# Patient Record
Sex: Female | Born: 1955
Health system: Southern US, Community
[De-identification: ages and names within clinical notes are randomized; demographics above are authoritative.]

## PROBLEM LIST (undated history)

## (undated) DIAGNOSIS — M1990 Unspecified osteoarthritis, unspecified site: Secondary | ICD-10-CM

## (undated) DIAGNOSIS — I8390 Asymptomatic varicose veins of unspecified lower extremity: Secondary | ICD-10-CM

## (undated) HISTORY — DX: Unspecified osteoarthritis, unspecified site: M19.90

## (undated) HISTORY — PX: KNEE SURGERY: SHX244

## (undated) HISTORY — DX: Asymptomatic varicose veins of unspecified lower extremity: I83.90

## (undated) HISTORY — PX: TONSILLECTOMY AND ADENOIDECTOMY: SUR1326

---

## 1984-07-21 HISTORY — PX: ECTOPIC PREGNANCY SURGERY: SHX613

## 2018-03-10 ENCOUNTER — Ambulatory Visit: Payer: Federal, State, Local not specified - PPO | Admitting: Family Medicine

## 2018-03-10 ENCOUNTER — Encounter: Payer: Self-pay | Admitting: Family Medicine

## 2018-03-10 VITALS — BP 128/74 | HR 74 | Temp 97.9°F | Ht 67.0 in | Wt 241.8 lb

## 2018-03-10 DIAGNOSIS — M1990 Unspecified osteoarthritis, unspecified site: Secondary | ICD-10-CM

## 2018-03-10 DIAGNOSIS — I8390 Asymptomatic varicose veins of unspecified lower extremity: Secondary | ICD-10-CM

## 2018-03-10 DIAGNOSIS — L989 Disorder of the skin and subcutaneous tissue, unspecified: Secondary | ICD-10-CM

## 2018-03-10 DIAGNOSIS — L680 Hirsutism: Secondary | ICD-10-CM | POA: Diagnosis not present

## 2018-03-10 MED ORDER — EFLORNITHINE HCL 13.9 % EX CREA: TOPICAL_CREAM | CUTANEOUS | 1 refills | Status: DC

## 2018-03-10 NOTE — Patient Instructions (Addendum)
It was very nice to see you today!  Please try the cream for your face. We will also refer you to dermatology.  Please continue your meds as needed for arthritis and let me know if you want to see our physical therapist.  Please try compression and leg elevation for your varicose veins. Let me know if you want to see a vein specialist.  Come back in early October for you flu shot.  Please come back to see me for your physical when you are due.   Take care, Dr Jerline Pain   Varicose Veins Varicose veins are veins that have become enlarged and twisted. They are usually seen in the legs but can occur in other parts of the body as well. What are the causes? This condition is the result of valves in the veins not working properly. Valves in the veins help to return blood from the leg to the heart. If these valves are damaged, blood flows backward and backs up into the veins in the leg near the skin. This causes the veins to become larger. What increases the risk? People who are on their feet a lot, who are pregnant, or who are overweight are more likely to develop varicose veins. What are the signs or symptoms?  Bulging, twisted-appearing, bluish veins, most commonly found on the legs.  Leg pain or a feeling of heaviness. These symptoms may be worse at the end of the day.  Leg swelling.  Changes in skin color. How is this diagnosed? A health care provider can usually diagnose varicose veins by examining your legs. Your health care provider may also recommend an ultrasound of your leg veins. How is this treated? Most varicose veins can be treated at home.However, other treatments are available for people who have persistent symptoms or want to improve the cosmetic appearance of the varicose veins. These treatment options include:  Sclerotherapy. A solution is injected into the vein to close it off.  Laser treatment. A laser is used to heat the vein to close it off.  Radiofrequency vein  ablation. An electrical current produced by radio waves is used to close off the vein.  Phlebectomy. The vein is surgically removed through small incisions made over the varicose vein.  Vein ligation and stripping. The vein is surgically removed through incisions made over the varicose vein after the vein has been tied (ligated).  Follow these instructions at home:  Do not stand or sit in one position for long periods of time. Do not sit with your legs crossed. Rest with your legs raised during the day.  Wear compression stockings as directed by your health care provider. These stockings help to prevent blood clots and reduce swelling in your legs.  Do not wear other tight, encircling garments around your legs, pelvis, or waist.  Walk as much as possible to increase blood flow.  Raise the foot of your bed at night with 2-inch blocks.  If you get a cut in the skin over the vein and the vein bleeds, lie down with your leg raised and press on it with a clean cloth until the bleeding stops. Then place a bandage (dressing) on the cut. See your health care provider if it continues to bleed. Contact a health care provider if:  The skin around your ankle starts to break down.  You have pain, redness, tenderness, or hard swelling in your leg over a vein.  You are uncomfortable because of leg pain. This information is not intended  to replace advice given to you by your health care provider. Make sure you discuss any questions you have with your health care provider. Document Released: 04/16/2005 Document Revised: 12/13/2015 Document Reviewed: 01/08/2016 Elsevier Interactive Patient Education  2017 [employer].

## 2018-03-10 NOTE — Assessment & Plan Note (Signed)
Symptoms are stable.  She will continue her glucosamine-chondroitin and ibuprofen.  Also recommended over-the-counter Tylenol as needed.  She is possibly interested in seeing our physical therapist however she deferred for the time being.  She will follow-up as needed.

## 2018-03-10 NOTE — Assessment & Plan Note (Signed)
No red flag signs or symptoms.  Discussed conservative measures with patient including frequent leg elevation and compression.  Discussed possible referral to vascular surgeon, however patient declined.  Discussed reasons to return to care.

## 2018-03-10 NOTE — Assessment & Plan Note (Signed)
Likely secondary to postmenopausal hormonal changes.  Will start [name] cream today.  Also place referral to dermatology for the above.  Discussed potential side effects of medication.

## 2018-03-10 NOTE — Progress Notes (Signed)
Subjective:  Kathy Joseph is a 62 y.o. female who presents today with a chief complaint of facial hair and to establish care.   HPI:  Facial hair, chronic problem, new to provider Patient with several year history of facial hair.  She has previously been prescribed a cream however does not remember the name of this.  She is also tried laser treatments in the past that did not significantly improve her symptoms.  She has not tried any recent treatments.  No obvious alleviating or aggravating factors.  Arthritis, chronic problem, new to provider Several year history.  Pain mostly located in bilateral knees and hands.  She takes glucosamine-chondroitin and ibuprofen as needed.  Symptoms are stable.  She has seen physical therapy in the past.  Varicose Veins, chronic problem, new to provider Several month history.  Located in bilateral lower legs however more prominent on the left.  No specific treatments tried.  Occasionally painful.  Worsened recently due to being on her feet while moving into her new house.  ROS: Per HPI, otherwise a complete review of systems was negative.   PMH:  The following were reviewed and entered/updated in epic: Past Medical History:  Diagnosis Date  . Arthritis   . Varicose veins of lower extremity    Patient Active Problem List   Diagnosis Date Noted  . Precancerous skin lesion 03/10/2018  . Hirsutism 03/10/2018  . Varicose veins of lower extremity 03/10/2018  . Arthritis 03/10/2018   Past Surgical History:  Procedure Laterality Date  . ECTOPIC PREGNANCY SURGERY  1986  . KNEE SURGERY Right   . TONSILLECTOMY AND ADENOIDECTOMY      Family History  Problem Relation Age of Onset  . Breast cancer Mother   . Colon cancer Father        Diagnosed in 76s.   . Breast cancer Sister   . Breast cancer Paternal Aunt     Medications- reviewed and updated Current Outpatient Medications  Medication Sig Dispense Refill  . glucosamine-chondroitin  500-400 MG tablet Take 1 tablet by mouth 3 (three) times daily.    Marland Kitchen ibuprofen (ADVIL,MOTRIN) 200 MG tablet Take 200 mg by mouth every 6 (six) hours as needed.    . Multiple Vitamin (MULTIVITAMIN) tablet Take 1 tablet by mouth daily.    . Eflornithine HCl ([name]) 13.9 % cream Apply thin film to face twice daily. 45 g 1   No current facility-administered medications for this visit.     Allergies-reviewed and updated Allergies  Allergen Reactions  . Cyclosporine Rash    Social History   Socioeconomic History  . Marital status: Married    Spouse name: Not on file  . Number of children: Not on file  . Years of education: Not on file  . Highest education level: Not on file  Occupational History  . Not on file  Social Needs  . Financial resource strain: Not on file  . Food insecurity:    Worry: Not on file    Inability: Not on file  . Transportation needs:    Medical: Not on file    Non-medical: Not on file  Tobacco Use  . Smoking status: Never Smoker  . Smokeless tobacco: Never Used  Substance and Sexual Activity  . Alcohol use: Yes    Alcohol/week: 3.0 standard drinks    Types: 3 Glasses of wine per week  . Drug use: Never  . Sexual activity: Yes    Partners: Male  Lifestyle  . Physical activity:  Days per week: Not on file    Minutes per session: Not on file  . Stress: Not on file  Relationships  . Social connections:    Talks on phone: Not on file    Gets together: Not on file    Attends religious service: Not on file    Active member of club or organization: Not on file    Attends meetings of clubs or organizations: Not on file    Relationship status: Not on file  Other Topics Concern  . Not on file  Social History Narrative  . Not on file    Objective:  Physical Exam: BP 128/74 (BP Location: Left Arm, Patient Position: Sitting, Cuff Size: Large)   Pulse 74   Temp 97.9 F (36.6 C) (Oral)   Ht 5\' 7"  (1.702 m)   Wt 241 lb 12.8 oz (109.7 kg)   SpO2  97%   BMI 37.87 kg/m   Gen: NAD, resting comfortably HEENT: Coarse white hair on lower mandible and chin. CV: RRR with no murmurs appreciated Pulm: NWOB, CTAB with no crackles, wheezes, or rhonchi GI: Normal bowel sounds present. Soft, Nontender, Nondistended. MSK:  -Knees: No deformities.  Crepitus with active range of motion.  Strength 5 out of 5 throughout.  Neurovascular intact distally. - Lower extremities: Bilateral varicosities noted. Skin: Warm, dry Neuro: Grossly normal, moves all extremities Psych: Normal affect and thought content  Assessment/Plan:  Varicose veins of lower extremity No red flag signs or symptoms.  Discussed conservative measures with patient including frequent leg elevation and compression.  Discussed possible referral to vascular surgeon, however patient declined.  Discussed reasons to return to care.  Precancerous skin lesion Will place referral to dermatology for annual skin check.  Hirsutism Likely secondary to postmenopausal hormonal changes.  Will start [name] cream today.  Also place referral to dermatology for the above.  Discussed potential side effects of medication.   Arthritis Symptoms are stable.  She will continue her glucosamine-chondroitin and ibuprofen.  Also recommended over-the-counter Tylenol as needed.  She is possibly interested in seeing our physical therapist however she deferred for the time being.  She will follow-up as needed.  Preventative Healthcare Patient was instructed to return soon for CPE.  We will obtain records from her previous PCP. Health Maintenance Due  Topic Date Due  . Hepatitis C Screening  [birth year]  . HIV Screening  11/06/1968  . TETANUS/TDAP  11/06/1972  . PAP SMEAR  11/07/1974  . MAMMOGRAM  11/07/2003  . INFLUENZA VACCINE  02/18/2018   Algis Greenhouse. Jerline Pain, MD 03/10/2018 10:50 AM

## 2018-03-10 NOTE — Assessment & Plan Note (Signed)
Will place referral to dermatology for annual skin check.

## 2018-05-04 ENCOUNTER — Ambulatory Visit (INDEPENDENT_AMBULATORY_CARE_PROVIDER_SITE_OTHER): Payer: Federal, State, Local not specified - PPO

## 2018-05-04 DIAGNOSIS — Z23 Encounter for immunization: Secondary | ICD-10-CM | POA: Diagnosis not present

## 2018-06-01 DIAGNOSIS — K080 Exfoliation of teeth due to systemic causes: Secondary | ICD-10-CM | POA: Diagnosis not present

## 2018-06-11 DIAGNOSIS — D485 Neoplasm of uncertain behavior of skin: Secondary | ICD-10-CM | POA: Diagnosis not present

## 2018-06-11 DIAGNOSIS — D229 Melanocytic nevi, unspecified: Secondary | ICD-10-CM | POA: Diagnosis not present

## 2018-06-11 DIAGNOSIS — L680 Hirsutism: Secondary | ICD-10-CM | POA: Diagnosis not present

## 2018-06-11 DIAGNOSIS — L905 Scar conditions and fibrosis of skin: Secondary | ICD-10-CM | POA: Diagnosis not present

## 2018-06-11 DIAGNOSIS — D224 Melanocytic nevi of scalp and neck: Secondary | ICD-10-CM | POA: Diagnosis not present

## 2018-06-11 DIAGNOSIS — D2239 Melanocytic nevi of other parts of face: Secondary | ICD-10-CM | POA: Diagnosis not present

## 2018-06-11 HISTORY — DX: Melanocytic nevi, unspecified: D22.9

## 2018-07-08 DIAGNOSIS — D2339 Other benign neoplasm of skin of other parts of face: Secondary | ICD-10-CM | POA: Diagnosis not present

## 2018-07-08 DIAGNOSIS — L905 Scar conditions and fibrosis of skin: Secondary | ICD-10-CM | POA: Diagnosis not present

## 2018-07-20 DIAGNOSIS — K080 Exfoliation of teeth due to systemic causes: Secondary | ICD-10-CM | POA: Diagnosis not present

## 2018-09-09 DIAGNOSIS — D0371 Melanoma in situ of right lower limb, including hip: Secondary | ICD-10-CM | POA: Insufficient documentation

## 2018-09-28 ENCOUNTER — Encounter: Payer: Self-pay | Admitting: Family Medicine

## 2018-09-28 ENCOUNTER — Ambulatory Visit: Payer: Federal, State, Local not specified - PPO | Admitting: Family Medicine

## 2018-09-28 VITALS — BP 110/66 | HR 67 | Temp 97.8°F | Ht 67.0 in | Wt 245.2 lb

## 2018-09-28 DIAGNOSIS — M7662 Achilles tendinitis, left leg: Secondary | ICD-10-CM

## 2018-09-28 DIAGNOSIS — M722 Plantar fascial fibromatosis: Secondary | ICD-10-CM

## 2018-09-28 DIAGNOSIS — L680 Hirsutism: Secondary | ICD-10-CM

## 2018-09-28 DIAGNOSIS — M79662 Pain in left lower leg: Secondary | ICD-10-CM

## 2018-09-28 MED ORDER — EFLORNITHINE HCL 13.9 % EX CREA: TOPICAL_CREAM | CUTANEOUS | 1 refills | Status: DC

## 2018-09-28 MED ORDER — DICLOFENAC SODIUM 75 MG PO TBEC: 75.0000 mg | DELAYED_RELEASE_TABLET | Freq: Two times a day (BID) | ORAL | 0 refills | Status: DC

## 2018-09-28 NOTE — Progress Notes (Signed)
   Chief Complaint:  Kathy Joseph is a 63 y.o. female who presents for same day appointment with a chief complaint of leg Joseph.   Assessment/Plan:  Left calf Joseph/plantar fasciitis/Achilles tendinitis Patient's left calf Joseph likely secondary to mild gastrocnemius strain related to her underlying plantar fasciitis and Achilles tendinitis.  She already has orthotics in place to treat her underlying plantar fasciitis and Achilles tendinitis.  Discussed home exercise program for his conditions and handout was given.  Will start diclofenac 75 mg twice daily for the next 1 to 2 weeks.  Will check d-dimer to rule out DVT per patient request.  Discussed reasons return to care.  Follow-up as needed.  Hirsutism Stable. Refilled [name].      Subjective:  HPI:  Leg Joseph, acute problem Patient has left calf and heel Joseph. Heel Joseph started a few months ago and calf Joseph started 4 days ago.  No clear precipitating events.  Joseph occurred randomly.  Located in the upper part of her left calf.  Tried using ice with no improvement.  Has a bit of Joseph when stretching and walking.  Some swelling to her left leg as well.  No recent prolonged immobilization.  No history of DVT or PE.  No chest Joseph or shortness of breath. No other obvious alleviating or aggravating factors.   # Hirsutisim - Using [name] with good efficacy.  No reported side effects.  ROS: Per HPI  PMH: She reports that she has never smoked. She has never used smokeless tobacco. She reports current alcohol use of about 3.0 standard drinks of alcohol per week. She reports that she does not use drugs.      Objective:  Physical Exam: BP 110/66 (BP Location: Left Arm, Patient Position: Sitting, Cuff Size: Large)   Pulse 67   Temp 97.8 F (36.6 C) (Oral)   Ht 5\' 7"  (1.702 m)   Wt 245 lb 4 oz (111.2 kg)   SpO2 95%   BMI 38.41 kg/m   Gen: NAD, resting comfortably MSK: -Left leg: Tender palpation along medial gastrocnemius head.  No  Joseph with resisted ankle plantarflexion or dorsiflexion.  Tender to palpation along distal insertion of Achilles tendon.  Tender location along calcaneus.     Kathy Joseph. Kathy Pain, MD 09/28/2018 3:07 PM

## 2018-09-28 NOTE — Patient Instructions (Signed)
It was very nice to see you today!  I think you have a strain in your left calf.  You have some irritation in your Achilles tendon and plantar fascia.  Please take the diclofenac twice daily for the next 1 to 2 weeks, then as needed.  Work on the exercises.  We will check blood work today to make sure you do not have a blood clot.  Let me know if your symptoms worsen or do not improve the next 7 to 10 days.  Take care, Dr Jerline Pain

## 2018-09-28 NOTE — Assessment & Plan Note (Signed)
Stable. Refilled [name].

## 2018-09-29 LAB — D-DIMER, QUANTITATIVE: D-Dimer, Quant: 0.37 mcg/mL FEU (ref <0.50)

## 2018-09-29 NOTE — Progress Notes (Signed)
Please inform patient of the following:  Her test for blood clot was NEGATIVE - she does not have a blood clot. Would like for her to continue the treatment plan we discussed and let us know if her symptoms do not improve.  Algis Greenhouse. Jerline Pain, MD 09/29/2018 8:05 AM

## 2019-01-20 DIAGNOSIS — C439 Malignant melanoma of skin, unspecified: Secondary | ICD-10-CM

## 2019-01-20 DIAGNOSIS — D0371 Melanoma in situ of right lower limb, including hip: Secondary | ICD-10-CM | POA: Diagnosis not present

## 2019-01-20 DIAGNOSIS — L905 Scar conditions and fibrosis of skin: Secondary | ICD-10-CM | POA: Diagnosis not present

## 2019-01-20 DIAGNOSIS — C4371 Malignant melanoma of right lower limb, including hip: Secondary | ICD-10-CM | POA: Diagnosis not present

## 2019-01-20 DIAGNOSIS — D229 Melanocytic nevi, unspecified: Secondary | ICD-10-CM | POA: Diagnosis not present

## 2019-01-20 HISTORY — DX: Malignant melanoma of skin, unspecified: C43.9

## 2019-02-15 ENCOUNTER — Ambulatory Visit: Payer: Federal, State, Local not specified - PPO | Admitting: Family Medicine

## 2019-02-16 DIAGNOSIS — L988 Other specified disorders of the skin and subcutaneous tissue: Secondary | ICD-10-CM | POA: Diagnosis not present

## 2019-02-16 DIAGNOSIS — C4371 Malignant melanoma of right lower limb, including hip: Secondary | ICD-10-CM | POA: Diagnosis not present

## 2019-03-08 ENCOUNTER — Ambulatory Visit: Payer: PPO | Admitting: Family Medicine

## 2019-03-10 ENCOUNTER — Telehealth: Payer: Self-pay

## 2019-03-10 NOTE — Telephone Encounter (Signed)
Called to notify patient that appointment scheduled on 03/10/19 for annual wellness visit will need to be canceled.   Ineligible for visit until 10/20/2019.  Upcoming visit on 03/14/19 with Dr. Jerline Pain can be done as a Welcome to [employer].

## 2019-03-10 NOTE — Telephone Encounter (Signed)
Call received from insurance on behalf of patient.   Patient received previous voicemail and was confused about coverage.  She was thinking that visits needed authorization prior to being scheduled. Explained to representative that voicemail left for patient was just notifying that appointment scheduled for 03/10/19 would be cancelled due to patient not having Medicare coverage for a full year yet.   Appointment for 03/14/19 still stands and will be at the provider's discretion of whether it is a Welcome to Medicare visit or a CPE.  Representative voiced understanding and stated that she would call patient and explain th her.

## 2019-03-11 ENCOUNTER — Ambulatory Visit: Payer: PPO

## 2019-03-14 ENCOUNTER — Ambulatory Visit (INDEPENDENT_AMBULATORY_CARE_PROVIDER_SITE_OTHER): Payer: PPO | Admitting: Family Medicine

## 2019-03-14 ENCOUNTER — Other Ambulatory Visit: Payer: Self-pay

## 2019-03-14 ENCOUNTER — Encounter: Payer: Self-pay | Admitting: Family Medicine

## 2019-03-14 VITALS — BP 118/76 | HR 66 | Temp 98.2°F | Ht 67.0 in | Wt 236.2 lb

## 2019-03-14 DIAGNOSIS — Z1159 Encounter for screening for other viral diseases: Secondary | ICD-10-CM

## 2019-03-14 DIAGNOSIS — M1990 Unspecified osteoarthritis, unspecified site: Secondary | ICD-10-CM | POA: Diagnosis not present

## 2019-03-14 DIAGNOSIS — Z23 Encounter for immunization: Secondary | ICD-10-CM

## 2019-03-14 DIAGNOSIS — Z0000 Encounter for general adult medical examination without abnormal findings: Secondary | ICD-10-CM | POA: Diagnosis not present

## 2019-03-14 DIAGNOSIS — Z13220 Encounter for screening for lipoid disorders: Secondary | ICD-10-CM

## 2019-03-14 DIAGNOSIS — Z131 Encounter for screening for diabetes mellitus: Secondary | ICD-10-CM

## 2019-03-14 MED ORDER — DIPHTHERIA-TETANUS TOXOIDS DT 25-5 LFU/0.5ML IM SUSP: INTRAMUSCULAR | 0 refills | Status: DC

## 2019-03-14 NOTE — Progress Notes (Signed)
Subjective:    Kathy Joseph is a 63 y.o. female who presents for Medicare Initial Preventive Examination.  She is doing well today however still has occasional pain in her right hip.  This is happened twice over the last few months.  Pain is very sharp and severe and then subsides spontaneously.  Does not currently have any pain.  No obvious precipitating events.  Preventive Screening-Counseling & Management  Tobacco Social History   Tobacco Use  Smoking Status Never Smoker  Smokeless Tobacco Never Used    Current Problems (verified) Patient Active Problem List   Diagnosis Date Noted  . Precancerous skin lesion 03/10/2018  . Hirsutism 03/10/2018  . Varicose veins of lower extremity 03/10/2018  . Arthritis 03/10/2018    Medications Prior to Visit Current Outpatient Medications on File Prior to Visit  Medication Sig Dispense Refill  . Eflornithine HCl ([name]) 13.9 % cream Apply thin film to face twice daily. 45 g 1  . glucosamine-chondroitin 500-400 MG tablet Take 1 tablet by mouth 3 (three) times daily.    . magnesium oxide (MAG-OX) 400 MG tablet Take 400 mg by mouth daily.    . Multiple Vitamin (MULTIVITAMIN) tablet Take 1 tablet by mouth daily.    Marland Kitchen VITAMIN D PO Take by mouth.     No current facility-administered medications on file prior to visit.     Current Medications (verified) Current Outpatient Medications  Medication Sig Dispense Refill  . Eflornithine HCl ([name]) 13.9 % cream Apply thin film to face twice daily. 45 g 1  . glucosamine-chondroitin 500-400 MG tablet Take 1 tablet by mouth 3 (three) times daily.    . magnesium oxide (MAG-OX) 400 MG tablet Take 400 mg by mouth daily.    . Multiple Vitamin (MULTIVITAMIN) tablet Take 1 tablet by mouth daily.    . Diphtheria-Tetanus Toxoids DT 25-5 LFU/0.5ML SUSP Inject once. 0.5 mL 0  . VITAMIN D PO Take by mouth.     No current facility-administered medications for this visit.      Allergies  (verified) Cyclosporine   PAST HISTORY  Past Medical History:  Diagnosis Date  . Arthritis   . Varicose veins of lower extremity    Past Surgical History:  Procedure Laterality Date  . ECTOPIC PREGNANCY SURGERY  1986  . KNEE SURGERY Right   . TONSILLECTOMY AND ADENOIDECTOMY     Family History  Problem Relation Age of Onset  . Breast cancer Mother   . Colon cancer Father        Diagnosed in 1s.   . Breast cancer Sister   . Breast cancer Paternal Aunt    Social History   Tobacco Use  . Smoking status: Never Smoker  . Smokeless tobacco: Never Used  Substance Use Topics  . Alcohol use: Yes    Alcohol/week: 3.0 standard drinks    Types: 3 Glasses of wine per week     Are there smokers in your home (other than you)? No  Risk Factors Current exercise habits: Home exercise routine includes swimming at the [facility].  Dietary issues discussed: Tries to eat a healthy and balanced diet.    Cardiac risk factors: advanced age (older than 48 for men, 32 for women).  Depression Screen (Note: if answer to either of the following is "Yes", a more complete depression screening is indicated)   Over the past 2 weeks, have you felt down, depressed or hopeless? No  Over the past 2 weeks, have you felt little interest or pleasure  in doing things? No  Have you lost interest or pleasure in daily life? No  Do you often feel hopeless? No  Do you cry easily over simple problems? No  Activities of Daily Living In your present state of health, do you have any difficulty performing the following activities?:  Driving? Yes Managing money?  No Feeding yourself? No Getting from bed to chair? No Climbing a flight of stairs? No Preparing food and eating?: No Bathing or showering? No Getting dressed: No Getting to the toilet? No Using the toilet:No Moving around from place to place: No In the past year have you fallen or had a near fall?:No   Are you sexually active?  No  Do you have more  than one partner?  No  Hearing Difficulties: No Do you often ask people to speak up or repeat themselves? No Do you experience ringing or noises in your ears? No Do you have difficulty understanding soft or whispered voices? No   Do you feel that you have a problem with memory? No  Do you often misplace items? No  Do you feel safe at home?  No  Cognitive Testing  Alert? Yes  Normal Appearance?Yes  Oriented to person? Yes  Place? Yes   Time? Yes  Recall of three objects?  Yes  Can perform simple calculations? Yes  Displays appropriate judgment?Yes  Can read the correct time from a watch face?Yes   Advanced Directives have been discussed with the patient? Yes  List the Names of Other Physician/Practitioners you currently use: Dr Denna Haggard - Dermatology  Indicate any recent Medical Services you may have received from other than [facility] providers in the past year (date may be approximate).  Immunization History  Administered Date(s) Administered  . Fluad Quad(high Dose 65+) 03/14/2019  . Influenza,inj,Quad PF,6+ Mos 05/04/2018  . Pneumococcal Polysaccharide-23 03/14/2019    Screening Tests Health Maintenance  Topic Date Due  . Hepatitis C Screening  1955-11-16  . HIV Screening  11/06/1968  . MAMMOGRAM  11/07/2003  . DEXA SCAN  11/07/2018  . PNA vac Low Risk Adult (1 of 2 - PCV13) 11/07/2018  . INFLUENZA VACCINE  02/19/2019  . PAP SMEAR-Modifier  03/13/2020 (Originally 11/07/1974)  . TETANUS/TDAP  03/13/2020 (Originally 11/06/1972)  . COLONOSCOPY  01/27/2027    All answers were reviewed with the patient and necessary referrals were made:  Dimas Chyle, MD   03/14/2019   History reviewed: allergies, current medications, past family history, past medical history, past social history, past surgical history and problem list  Review of Systems Pertinent items are noted in HPI.    Objective:     Vision by Snellen chart: right eye:20/20, left eye:20/20  Body mass index is  36.99 kg/m. BP 118/76   Pulse 66   Temp 98.2 F (36.8 C)   Ht 5\' 7"  (1.702 m)   Wt 236 lb 3.2 oz (107.1 kg)   SpO2 97%   BMI 36.99 kg/m  Wt Readings from Last 3 Encounters:  03/14/19 236 lb 3.2 oz (107.1 kg)  09/28/18 245 lb 4 oz (111.2 kg)  03/10/18 241 lb 12.8 oz (109.7 kg)  Gen: NAD, resting comfortably CV: RRR with no murmurs appreciated Pulm: NWOB, CTAB with no crackles, wheezes, or rhonchi GI: Normal bowel sounds present. Soft, Nontender, Nondistended. MSK: no edema, cyanosis, or clubbing noted Skin: warm, dry Neuro: grossly normal, moves all extremities Psych: Normal affect and thought content      Assessment:     63 year  old woman here for [facility] to [facility] Visit and doing well.      Plan:     During the course of the visit the patient was educated and counseled about appropriate screening and preventive services including:    Pneumococcal vaccine   Influenza vaccine  Td vaccine  Screening mammography  Bone densitometry screening  Colorectal cancer screening  Diabetes screening  Nutrition counseling   Patient Instructions (the written plan) was given to the patient.  Medicare Attestation I have personally reviewed: The patient's medical and social history Their use of alcohol, tobacco or illicit drugs Their current medications and supplements The patient's functional ability including ADLs,fall risks, home safety risks, cognitive, and hearing and visual impairment Diet and physical activities Evidence for depression or mood disorders  The patient's weight, height, BMI, and visual acuity have been recorded in the chart.  I have made referrals, counseling, and provided education to the patient based on review of the above and I have provided the patient with a written personalized care plan for preventive services.     Dimas Chyle, MD   03/14/2019   Recommend over-the-counter Voltaren as needed for right hip pain  Patient will get  pneumonia vaccine and flu vaccine today.  Prescription was written for tetanus vaccine.  She will go to the pharmacy to get this done.  She was advised to get her mammogram and bone density scan done soon.  She is up-to-date on colon cancer screening.  Due to her age, she no longer needs cervical cancer screening.

## 2019-03-14 NOTE — Patient Instructions (Addendum)
It was very nice to see you today!  You can try using voltaren gel to your hip if the pain comes back.  We will give you your pneumonia vaccine and flu vaccine today.  You will be due for your next pneumonia vaccine next year.  I will print out a prescription for your tetanus vaccine.   Please get your bone density scan and mammogram done soon.  We will check blood work today.  Come back to see me in 1 year for your physical, or sooner if needed.  Take care, Dr Jerline Pain  Please try these tips to maintain a healthy lifestyle:   Eat at least 3 REAL meals and 1-2 snacks per day.  Aim for no more than 5 hours between eating.  If you eat breakfast, please do so within one hour of getting up.    Obtain twice as many fruits/vegetables as protein or carbohydrate foods for both lunch and dinner. (Half of each meal should be fruits/vegetables, one quarter protein, and one quarter starchy carbs)   Cut down on sweet beverages. This includes juice, soda, and sweet tea.    Exercise at least 150 minutes every week.    Preventive Care 51 Years and Older, Female Preventive care refers to lifestyle choices and visits with your health care provider that can promote health and wellness. This includes:  A yearly physical exam. This is also called an annual well check.  Regular dental and eye exams.  Immunizations.  Screening for certain conditions.  Healthy lifestyle choices, such as diet and exercise. What can I expect for my preventive care visit? Physical exam Your health care provider will check:  Height and weight. These may be used to calculate body mass index (BMI), which is a measurement that tells if you are at a healthy weight.  Heart rate and blood pressure.  Your skin for abnormal spots. Counseling Your health care provider may ask you questions about:  Alcohol, tobacco, and drug use.  Emotional well-being.  Home and relationship well-being.  Sexual activity.   Eating habits.  History of falls.  Memory and ability to understand (cognition).  Work and work Statistician.  Pregnancy and menstrual history. What immunizations do I need?  Influenza (flu) vaccine  This is recommended every year. Tetanus, diphtheria, and pertussis (Tdap) vaccine  You may need a Td booster every 10 years. Varicella (chickenpox) vaccine  You may need this vaccine if you have not already been vaccinated. Zoster (shingles) vaccine  You may need this after age 66. Pneumococcal conjugate (PCV13) vaccine  One dose is recommended after age 11. Pneumococcal polysaccharide (PPSV23) vaccine  One dose is recommended after age 67. Measles, mumps, and rubella (MMR) vaccine  You may need at least one dose of MMR if you were born in 1957 or later. You may also need a second dose. Meningococcal conjugate (MenACWY) vaccine  You may need this if you have certain conditions. Hepatitis A vaccine  You may need this if you have certain conditions or if you travel or work in places where you may be exposed to hepatitis A. Hepatitis B vaccine  You may need this if you have certain conditions or if you travel or work in places where you may be exposed to hepatitis B. Haemophilus influenzae type b (Hib) vaccine  You may need this if you have certain conditions. You may receive vaccines as individual doses or as more than one vaccine together in one shot (combination vaccines). Talk with your health  care provider about the risks and benefits of combination vaccines. What tests do I need? Blood tests  Lipid and cholesterol levels. These may be checked every 5 years, or more frequently depending on your overall health.  Hepatitis C test.  Hepatitis B test. Screening  Lung cancer screening. You may have this screening every year starting at age 48 if you have a 30-pack-year history of smoking and currently smoke or have quit within the past 15 years.  Colorectal cancer  screening. All adults should have this screening starting at age 92 and continuing until age 15. Your health care provider may recommend screening at age 23 if you are at increased risk. You will have tests every 1-10 years, depending on your results and the type of screening test.  Diabetes screening. This is done by checking your blood sugar (glucose) after you have not eaten for a while (fasting). You may have this done every 1-3 years.  Mammogram. This may be done every 1-2 years. Talk with your health care provider about how often you should have regular mammograms.  BRCA-related cancer screening. This may be done if you have a family history of breast, ovarian, tubal, or peritoneal cancers. Other tests  Sexually transmitted disease (STD) testing.  Bone density scan. This is done to screen for osteoporosis. You may have this done starting at age 6. Follow these instructions at home: Eating and drinking  Eat a diet that includes fresh fruits and vegetables, whole grains, lean protein, and low-fat dairy products. Limit your intake of foods with high amounts of sugar, saturated fats, and salt.  Take vitamin and mineral supplements as recommended by your health care provider.  Do not drink alcohol if your health care provider tells you not to drink.  If you drink alcohol: ? Limit how much you have to 0-1 drink a day. ? Be aware of how much alcohol is in your drink. In the [place] one drink equals one 12 oz bottle of beer (355 mL), one 5 oz glass of wine (148 mL), or one 1 oz glass of hard liquor (44 mL). Lifestyle  Take daily care of your teeth and gums.  Stay active. Exercise for at least 30 minutes on 5 or more days each week.  Do not use any products that contain nicotine or tobacco, such as cigarettes, e-cigarettes, and chewing tobacco. If you need help quitting, ask your health care provider.  If you are sexually active, practice safe sex. Use a condom or other form of  protection in order to prevent STIs (sexually transmitted infections).  Talk with your health care provider about taking a low-dose aspirin or statin. What's next?  Go to your health care provider once a year for a well check visit.  Ask your health care provider how often you should have your eyes and teeth checked.  Stay up to date on all vaccines. This information is not intended to replace advice given to you by your health care provider. Make sure you discuss any questions you have with your health care provider. Document Released: 08/03/2015 Document Revised: 07/01/2018 Document Reviewed: 07/01/2018 Elsevier Patient Education  2020 [employer].

## 2019-03-15 ENCOUNTER — Encounter: Payer: Self-pay | Admitting: Family Medicine

## 2019-03-15 DIAGNOSIS — E785 Hyperlipidemia, unspecified: Secondary | ICD-10-CM | POA: Insufficient documentation

## 2019-03-15 LAB — LIPID PANEL
Cholesterol: 210 mg/dL — ABNORMAL HIGH (ref 0–200)
HDL: 65 mg/dL (ref >39.00)
LDL Cholesterol: 108 mg/dL — ABNORMAL HIGH (ref 0–99)
NonHDL: 145.41
Total CHOL/HDL Ratio: 3
Triglycerides: 185 mg/dL — ABNORMAL HIGH (ref 0.0–149.0)
VLDL: 37 mg/dL (ref 0.0–40.0)

## 2019-03-15 LAB — COMPREHENSIVE METABOLIC PANEL
ALT: 17 U/L (ref 0–35)
AST: 18 U/L (ref 0–37)
Albumin: 4.4 g/dL (ref 3.5–5.2)
Alkaline Phosphatase: 82 U/L (ref 39–117)
BUN: 14 mg/dL (ref 6–23)
CO2: 29 mEq/L (ref 19–32)
Calcium: 9.3 mg/dL (ref 8.4–10.5)
Chloride: 104 mEq/L (ref 96–112)
Creatinine, Ser: 0.87 mg/dL (ref 0.40–1.20)
GFR: 65.27 mL/min (ref >60.00)
Glucose, Bld: 96 mg/dL (ref 70–99)
Potassium: 4.1 mEq/L (ref 3.5–5.1)
Sodium: 140 mEq/L (ref 135–145)
Total Bilirubin: 0.4 mg/dL (ref 0.2–1.2)
Total Protein: 6.5 g/dL (ref 6.0–8.3)

## 2019-03-15 LAB — HEPATITIS C ANTIBODY
Hepatitis C Ab: NONREACTIVE
SIGNAL TO CUT-OFF: 0.01 (ref <1.00)

## 2019-03-15 LAB — CBC
HCT: 40.4 % (ref 36.0–46.0)
Hemoglobin: 13.7 g/dL (ref 12.0–15.0)
MCHC: 34 g/dL (ref 30.0–36.0)
MCV: 95.3 fl (ref 78.0–100.0)
Platelets: 185 10*3/uL (ref 150.0–400.0)
RBC: 4.24 Mil/uL (ref 3.87–5.11)
RDW: 12 % (ref 11.5–15.5)
WBC: 5.7 10*3/uL (ref 4.0–10.5)

## 2019-03-15 LAB — TSH: TSH: 1.71 u[IU]/mL (ref 0.35–4.50)

## 2019-03-15 NOTE — Progress Notes (Signed)
Please inform patient of the following:  Cholesterol is a bit high but everything else looks great. Do not need to start medications for cholesterol. She can continue working on diet and exercise and we can recheck in a year or so.  Algis Greenhouse. Jerline Pain, MD 03/15/2019 12:59 PM

## 2019-03-23 DIAGNOSIS — D039 Melanoma in situ, unspecified: Secondary | ICD-10-CM | POA: Insufficient documentation

## 2019-03-24 ENCOUNTER — Other Ambulatory Visit: Payer: Self-pay | Admitting: Family Medicine

## 2019-03-24 ENCOUNTER — Telehealth: Payer: Self-pay | Admitting: Family Medicine

## 2019-03-24 DIAGNOSIS — Z1231 Encounter for screening mammogram for malignant neoplasm of breast: Secondary | ICD-10-CM

## 2019-03-24 NOTE — Telephone Encounter (Signed)
Okay for order?

## 2019-03-24 NOTE — Telephone Encounter (Signed)
Pt called in to request the order placed for bone density. Pt says that she scheduled mammogram as advised by PCP but they were unable to schedule bone density.   Please advise pt when order is placed for scheduling   CB: [phone]

## 2019-03-24 NOTE — Telephone Encounter (Signed)
Please advise 

## 2019-03-25 ENCOUNTER — Other Ambulatory Visit: Payer: Self-pay

## 2019-03-25 DIAGNOSIS — E2839 Other primary ovarian failure: Secondary | ICD-10-CM

## 2019-03-25 NOTE — Telephone Encounter (Signed)
Ok with me. Please place any necessary orders. 

## 2019-03-25 NOTE — Telephone Encounter (Signed)
Order has been placed.  Please schedule patient for bone density screening.

## 2019-03-25 NOTE — Telephone Encounter (Signed)
Called pt to schedule. No answer, LVM.  °

## 2019-03-30 ENCOUNTER — Other Ambulatory Visit: Payer: Self-pay

## 2019-03-30 ENCOUNTER — Ambulatory Visit (INDEPENDENT_AMBULATORY_CARE_PROVIDER_SITE_OTHER)
Admission: RE | Admit: 2019-03-30 | Discharge: 2019-03-30 | Disposition: A | Discharge status: Home / Self Care | Payer: PPO | Source: Ambulatory Visit | Attending: Family Medicine | Admitting: Family Medicine

## 2019-03-30 DIAGNOSIS — E2839 Other primary ovarian failure: Secondary | ICD-10-CM | POA: Diagnosis not present

## 2019-04-07 NOTE — Progress Notes (Signed)
Please inform patient of the following:  Bone density scan is normal. Would like for her to continue with calcium (1200mg  daily)  and vitamin D (800 IU daily) supplementation and we can recheck in 2 years.

## 2019-04-08 ENCOUNTER — Telehealth: Payer: Self-pay

## 2019-04-08 NOTE — Telephone Encounter (Signed)
Pt. Given bone density results and instructions. Verbalizes understanding.

## 2019-05-10 ENCOUNTER — Ambulatory Visit: Payer: PPO

## 2019-05-17 ENCOUNTER — Other Ambulatory Visit: Payer: Self-pay

## 2019-05-17 ENCOUNTER — Ambulatory Visit
Admission: RE | Admit: 2019-05-17 | Discharge: 2019-05-17 | Disposition: A | Discharge status: Home / Self Care | Payer: PPO | Source: Ambulatory Visit | Attending: Family Medicine | Admitting: Family Medicine

## 2019-05-17 DIAGNOSIS — Z1231 Encounter for screening mammogram for malignant neoplasm of breast: Secondary | ICD-10-CM

## 2019-06-08 ENCOUNTER — Other Ambulatory Visit: Payer: Self-pay | Admitting: Family Medicine

## 2019-06-08 ENCOUNTER — Encounter: Payer: Self-pay | Admitting: Family Medicine

## 2019-06-08 DIAGNOSIS — R928 Other abnormal and inconclusive findings on diagnostic imaging of breast: Secondary | ICD-10-CM

## 2019-06-14 ENCOUNTER — Other Ambulatory Visit: Payer: Self-pay

## 2019-06-14 ENCOUNTER — Ambulatory Visit
Admission: RE | Admit: 2019-06-14 | Discharge: 2019-06-14 | Disposition: A | Discharge status: Home / Self Care | Payer: PPO | Source: Ambulatory Visit | Attending: Family Medicine | Admitting: Family Medicine

## 2019-06-14 ENCOUNTER — Other Ambulatory Visit: Payer: Self-pay | Admitting: Family Medicine

## 2019-06-14 ENCOUNTER — Other Ambulatory Visit: Payer: PPO

## 2019-06-14 DIAGNOSIS — R928 Other abnormal and inconclusive findings on diagnostic imaging of breast: Secondary | ICD-10-CM | POA: Diagnosis not present

## 2019-06-14 DIAGNOSIS — N6489 Other specified disorders of breast: Secondary | ICD-10-CM | POA: Diagnosis not present

## 2019-06-15 ENCOUNTER — Other Ambulatory Visit: Payer: Self-pay

## 2019-07-25 ENCOUNTER — Ambulatory Visit (INDEPENDENT_AMBULATORY_CARE_PROVIDER_SITE_OTHER): Payer: PPO | Admitting: Podiatry

## 2019-07-25 ENCOUNTER — Other Ambulatory Visit: Payer: Self-pay

## 2019-07-25 ENCOUNTER — Encounter: Payer: Self-pay | Admitting: Podiatry

## 2019-07-25 DIAGNOSIS — M79676 Pain in unspecified toe(s): Secondary | ICD-10-CM | POA: Diagnosis not present

## 2019-07-25 DIAGNOSIS — L600 Ingrowing nail: Secondary | ICD-10-CM

## 2019-07-25 DIAGNOSIS — M2141 Flat foot [pes planus] (acquired), right foot: Secondary | ICD-10-CM

## 2019-07-25 DIAGNOSIS — M2142 Flat foot [pes planus] (acquired), left foot: Secondary | ICD-10-CM | POA: Diagnosis not present

## 2019-07-25 NOTE — Patient Instructions (Signed)

## 2019-07-25 NOTE — Progress Notes (Signed)
  Subjective:  Patient ID: Irania Joseph, female    DOB: 1955-09-29,  MRN: [id number]  Chief Complaint  Patient presents with  . Nail Problem    Rt hallux medial borders x 5 wks; 5/10 discomofrt -wrose with shoes and socks -w/ slight redness -pt denies swellgin/drainage Tx: epsom salt soaking -pt interested in orthotics    64 y.o. female presents with the above complaint. History confirmed with patient. Also would like new orthotics.  Objective:  Physical Exam: warm, good capillary refill, nail exam normal nails without lesions and ingrown nail at right hallux medial border, no trophic changes or ulcerative lesions, normal DP and PT pulses and normal sensory exam. Left Foot: normal exam, no swelling, tenderness, instability; ligaments intact, full range of motion of all ankle/foot joints  Right Foot: normal exam, no swelling, tenderness, instability; ligaments intact, full range of motion of all ankle/foot joints   No images are attached to the encounter.  Radiographs:  X-ray of both feet: not indicated   Assessment:   1. Ingrown nail   2. Pain around toenail   3. Pes planus of both feet      Plan:  Patient was evaluated and treated and all questions answered.  Ingrown Nail -Patient elects to proceed with ingrown toenail removal today -Ingrown nail excised. See procedure note. -Educated on post-procedure care including soaking. Written instructions provided. -Patient to follow up in 2 weeks for nail check. -Remaining nails trimmed as courtesy  Procedure: Excision of Ingrown Toenail Location: Right 1st toe medial nail borders. Anesthesia: Lidocaine 1% plain; 1.77mL and Marcaine 0.5% plain; 1.44mL, digital block. Skin Prep: Alcohol. Dressing: Silvadene; telfa; dry, sterile, compression dressing. Technique: Following skin prep, the toe was exsanguinated and a tourniquet was secured at the base of the toe. The affected nail border was freed, split with a nail splitter, and  excised.  The tourniquet was then removed and sterile dressing applied. Disposition: Patient tolerated procedure well. Patient to return in 2 weeks for follow-up.      Pes Planus -Casted for [employer]   Return if symptoms worsen or fail to improve, for Nail Check.   MDM Number of Diagnoses or Management Options Ingrown nail: new, needed workup Pain around toenail: new, needed workup Pes planus of both feet: new, needed workup Risk of Complications, Morbidity, and/or Mortality Presenting problems: low Management options: moderate

## 2019-07-28 ENCOUNTER — Telehealth: Payer: Self-pay

## 2019-07-28 NOTE — Telephone Encounter (Signed)
Pt would like to know if orthotics are cover. Thnks

## 2019-08-01 ENCOUNTER — Telehealth: Payer: Self-pay | Admitting: Podiatry

## 2019-08-01 NOTE — Telephone Encounter (Signed)
Pt returned call and is scheduled to see Betha on 2.2.2021 in [place].

## 2019-08-01 NOTE — Telephone Encounter (Signed)
Left message for pt that I received auth from [facility] and to call to discuss and schedule an appt for [place] office.

## 2019-08-01 NOTE — Telephone Encounter (Signed)
Noted thanks °

## 2019-08-08 ENCOUNTER — Telehealth: Payer: Self-pay

## 2019-08-08 NOTE — Telephone Encounter (Signed)
Pt called she had Partial ingrown removed 2 weeks ago, Pt would like to know if she can start doing water aerobics at [facility].

## 2019-08-08 NOTE — Telephone Encounter (Signed)
Yes can you call and let her know

## 2019-08-09 NOTE — Telephone Encounter (Signed)
I informed pt of Dr. Eleanora Neighbor 08/08/2019 3:28pm statement.

## 2019-08-23 ENCOUNTER — Other Ambulatory Visit: Payer: PPO | Admitting: Orthotics

## 2019-08-23 ENCOUNTER — Other Ambulatory Visit: Payer: Self-pay

## 2019-08-23 DIAGNOSIS — M2042 Other hammer toe(s) (acquired), left foot: Secondary | ICD-10-CM | POA: Diagnosis not present

## 2019-08-23 DIAGNOSIS — M2141 Flat foot [pes planus] (acquired), right foot: Secondary | ICD-10-CM | POA: Diagnosis not present

## 2019-10-03 ENCOUNTER — Ambulatory Visit (INDEPENDENT_AMBULATORY_CARE_PROVIDER_SITE_OTHER): Payer: PPO | Admitting: Physician Assistant

## 2019-10-03 ENCOUNTER — Encounter: Payer: Self-pay | Admitting: Physician Assistant

## 2019-10-03 ENCOUNTER — Other Ambulatory Visit: Payer: Self-pay

## 2019-10-03 DIAGNOSIS — Z85820 Personal history of malignant melanoma of skin: Secondary | ICD-10-CM | POA: Diagnosis not present

## 2019-10-03 DIAGNOSIS — L719 Rosacea, unspecified: Secondary | ICD-10-CM | POA: Diagnosis not present

## 2019-10-03 DIAGNOSIS — Z86006 Personal history of melanoma in-situ: Secondary | ICD-10-CM | POA: Diagnosis not present

## 2019-10-03 DIAGNOSIS — Z86018 Personal history of other benign neoplasm: Secondary | ICD-10-CM | POA: Diagnosis not present

## 2019-10-03 DIAGNOSIS — Z1283 Encounter for screening for malignant neoplasm of skin: Secondary | ICD-10-CM | POA: Diagnosis not present

## 2019-10-03 NOTE — Progress Notes (Addendum)
   Follow-Up Visit   Subjective  Kathy Joseph is a 64 y.o. female who presents for the following: Melanoma (6 month follow up for melanoma in situ on R distal calf) and Rosacea (irritation from mask).  The following portions of the chart were reviewed this encounter and updated as appropriate:    History: Patient was seen last for skin exam 01/20/2019. We biopsied a melanoma in situ from her Right calf. She has a history of a moderately atypical mole below the left ear and a moderately to severe of the Right mid forehead (wider shave performed). The MIS was excised with Dr. Denna Haggard on 02/16/19. Has been having angiomas lasered and is happy with the progress she is having with this. Review of Systems: Pertinent items noted in HPI and remainder of comprehensive ROS otherwise negative.  Objective  Well appearing patient in no apparent distress; mood and affect are within normal limits.  A full examination was performed including scalp, head, eyes, ears, nose, lips, neck, chest, axillae, abdomen, back, buttocks, bilateral upper extremities, bilateral lower extremities, hands, feet, fingers, toes, fingernails, and toenails. All findings within normal limits unless otherwise noted below. No suspicious moles on back MIS site on R post calf was clear Below left ear was clear Right mid forehead was clear Few inflamed papules noted cheeks  No skin findings found.  Assessment & Plan  Full skin check again in 6 months if normal will be able to go to once a year. Discussed continued use of Retinol OTC and addition of salicylic acid in AM for acne/rosacea.  History of MIS History of atypical nevi  , Oasis Goehring Clark-Bruning, PA-C, have reviewed all documentation for this visit. The documentation on 11/18/19 for the exam, diagnosis, procedures, and orders are all accurate and complete.

## 2019-10-03 NOTE — Progress Notes (Signed)
No suspicious moles on back MIS site on R post calf was clear Below left ear was clear Right mid forehead was clear

## 2019-10-25 ENCOUNTER — Other Ambulatory Visit: Payer: Self-pay

## 2019-10-25 ENCOUNTER — Ambulatory Visit (INDEPENDENT_AMBULATORY_CARE_PROVIDER_SITE_OTHER)
Admission: RE | Admit: 2019-10-25 | Discharge: 2019-10-25 | Disposition: A | Discharge status: Home / Self Care | Payer: PPO | Source: Ambulatory Visit

## 2019-10-25 DIAGNOSIS — M722 Plantar fascial fibromatosis: Secondary | ICD-10-CM | POA: Diagnosis not present

## 2019-10-25 NOTE — Discharge Instructions (Addendum)
Continue to rest and elevated your foot.  Apply ice packs for 15-20 minutes 2-3 times a day.  Take ibuprofen as discussed.    Schedule an appointment with your podiatrist if your symptoms are not improving.

## 2019-10-25 NOTE — ED Provider Notes (Signed)
Virtual Visit via Video Note:  Kathy Joseph  initiated request for Telemedicine visit with [facility] team. I connected with Kathy Joseph  on 10/25/2019 at 4:21 PM  for a synchronized telemedicine visit using a video enabled HIPPA compliant telemedicine application. I verified that I am speaking with Kathy Joseph  using two identifiers. [name], NP  was physically located in a [facility] Urgent care site and Chantal Hatch was located at a different location.   The limitations of evaluation and management by telemedicine as well as the availability of in-person appointments were discussed. Patient was informed that she  may incur a bill ( including co-pay) for this virtual visit encounter. Kathy Joseph  expressed understanding and gave verbal consent to proceed with virtual visit.     History of Present Illness:Kathy Joseph  is a 64 y.o. female presents for evaluation of pain in her left heel since going on a 4 mile walk yesterday.  She denies redness, warmth,  numbness, weakness, or paresthesias.  No wounds or rash.  She has been treating the heel pain at home with rest, elevation, ice packs.   She wears orthotics and is followed by podiatrist.     Allergies  Allergen Reactions  . Cyclosporine Rash  . Cephalosporins Hives     Past Medical History:  Diagnosis Date  . Arthritis   . Atypical nevus 06/11/2018   below left ear, moderate atypia  . Atypical nevus 06/11/2018   right mid forehead, moderate to severe (wider shave done 07/08/2018)  . Melanoma ([place]) 01/20/2019   melanoma in situ on right distal calf [place] on 07/29/202  . Varicose veins of lower extremity      Social History   Tobacco Use  . Smoking status: Never Smoker  . Smokeless tobacco: Never Used  Substance Use Topics  . Alcohol use: Yes    Alcohol/week: 3.0 standard drinks    Types: 3 Glasses of wine per week  . Drug use: Never   ROS: as stated in HPI.  All other systems  reviewed and negative.      Observations/Objective: Physical Exam  VITALS: Patient denies fever. GENERAL: Alert, appears well and in no acute distress. HEENT: Atraumatic. NECK: Normal movements of the head and neck. CARDIOPULMONARY: No increased WOB. Speaking in clear sentences. I:E ratio WNL.  MS: Moves all visible extremities without noticeable abnormality. PSYCH: Pleasant and cooperative, well-groomed. Speech normal rate and rhythm. Affect is appropriate. Insight and judgement are appropriate. Attention is focused, linear, and appropriate.  NEURO: CN grossly intact. Oriented as arrived to appointment on time with no prompting. Moves both UE equally.  SKIN: No obvious lesions, wounds, erythema, or cyanosis noted on face or hands.   Assessment and Plan:    ICD-10-CM   1. Plantar fasciitis  M72.2        Follow Up Instructions: Treating with ibuprofen, rest, elevation, ice packs.  Instructed patient to follow-up with her podiatrist if her symptoms are not improving.  Instructed her to come here to be seen in person if she has new symptoms such as redness, warmth, wounds, numbness, paresthesias, weakness, or other concerns.  Patient agrees to plan of care.    I discussed the assessment and treatment plan with the patient. The patient was provided an opportunity to ask questions and all were answered. The patient agreed with the plan and demonstrated an understanding of the instructions.   The patient was advised to call back or seek an in-person  evaluation if the symptoms worsen or if the condition fails to improve as anticipated.      [name], NP  10/25/2019 4:21 PM         [name], NP 10/25/19 [phone]

## 2019-11-23 DIAGNOSIS — H43811 Vitreous degeneration, right eye: Secondary | ICD-10-CM | POA: Diagnosis not present

## 2019-11-23 DIAGNOSIS — H43393 Other vitreous opacities, bilateral: Secondary | ICD-10-CM | POA: Diagnosis not present

## 2019-11-25 ENCOUNTER — Telehealth: Payer: Self-pay | Admitting: Family Medicine

## 2019-11-25 NOTE — Telephone Encounter (Signed)
Pt advised to schedule F/U ED appointment with PCP Call transfer to front office

## 2019-11-25 NOTE — Telephone Encounter (Signed)
Dr Jerline Pain gave patient a script for [name] Cream back in March 2020 that she never had to use. But needs it now. Please advise. jk

## 2019-11-28 ENCOUNTER — Telehealth: Payer: Self-pay | Admitting: Physician Assistant

## 2019-11-28 MED ORDER — VANIQA 13.9 % EX CREA: 1.0000 "application " | TOPICAL_CREAM | Freq: Every day | CUTANEOUS | 2 refills | Status: DC

## 2019-11-28 NOTE — Telephone Encounter (Signed)
Refill needed of [name]. Per patient [name] told her to call when refill was needed. Please send to [facility] [address].

## 2019-12-12 ENCOUNTER — Other Ambulatory Visit: Payer: Self-pay | Admitting: Family Medicine

## 2019-12-12 ENCOUNTER — Ambulatory Visit
Admission: RE | Admit: 2019-12-12 | Discharge: 2019-12-12 | Disposition: A | Discharge status: Home / Self Care | Payer: PPO | Source: Ambulatory Visit | Attending: Family Medicine | Admitting: Family Medicine

## 2019-12-12 ENCOUNTER — Other Ambulatory Visit: Payer: Self-pay

## 2019-12-12 DIAGNOSIS — N6489 Other specified disorders of breast: Secondary | ICD-10-CM

## 2019-12-12 DIAGNOSIS — R928 Other abnormal and inconclusive findings on diagnostic imaging of breast: Secondary | ICD-10-CM | POA: Diagnosis not present

## 2019-12-12 DIAGNOSIS — N6313 Unspecified lump in the right breast, lower outer quadrant: Secondary | ICD-10-CM | POA: Diagnosis not present

## 2019-12-13 ENCOUNTER — Other Ambulatory Visit: Payer: Self-pay

## 2019-12-13 ENCOUNTER — Ambulatory Visit
Admission: RE | Admit: 2019-12-13 | Discharge: 2019-12-13 | Disposition: A | Discharge status: Home / Self Care | Payer: PPO | Source: Ambulatory Visit | Attending: Family Medicine | Admitting: Family Medicine

## 2019-12-13 DIAGNOSIS — N6489 Other specified disorders of breast: Secondary | ICD-10-CM

## 2019-12-13 DIAGNOSIS — D174 Benign lipomatous neoplasm of intrathoracic organs: Secondary | ICD-10-CM | POA: Diagnosis not present

## 2019-12-13 DIAGNOSIS — N6313 Unspecified lump in the right breast, lower outer quadrant: Secondary | ICD-10-CM | POA: Diagnosis not present

## 2019-12-27 DIAGNOSIS — N6310 Unspecified lump in the right breast, unspecified quadrant: Secondary | ICD-10-CM | POA: Diagnosis not present

## 2019-12-27 DIAGNOSIS — Z803 Family history of malignant neoplasm of breast: Secondary | ICD-10-CM | POA: Diagnosis not present

## 2019-12-28 DIAGNOSIS — H43393 Other vitreous opacities, bilateral: Secondary | ICD-10-CM | POA: Diagnosis not present

## 2019-12-28 DIAGNOSIS — H43811 Vitreous degeneration, right eye: Secondary | ICD-10-CM | POA: Diagnosis not present

## 2019-12-28 DIAGNOSIS — H01009 Unspecified blepharitis unspecified eye, unspecified eyelid: Secondary | ICD-10-CM | POA: Diagnosis not present

## 2019-12-30 DIAGNOSIS — Z803 Family history of malignant neoplasm of breast: Secondary | ICD-10-CM | POA: Diagnosis not present

## 2019-12-30 DIAGNOSIS — Z800 Family history of malignant neoplasm of digestive organs: Secondary | ICD-10-CM | POA: Diagnosis not present

## 2019-12-30 DIAGNOSIS — Z8049 Family history of malignant neoplasm of other genital organs: Secondary | ICD-10-CM | POA: Diagnosis not present

## 2020-02-07 DIAGNOSIS — N6310 Unspecified lump in the right breast, unspecified quadrant: Secondary | ICD-10-CM | POA: Diagnosis not present

## 2020-02-27 DIAGNOSIS — Z1152 Encounter for screening for COVID-19: Secondary | ICD-10-CM | POA: Diagnosis not present

## 2020-03-01 DIAGNOSIS — Z6836 Body mass index (BMI) 36.0-36.9, adult: Secondary | ICD-10-CM | POA: Diagnosis not present

## 2020-03-01 DIAGNOSIS — C50911 Malignant neoplasm of unspecified site of right female breast: Secondary | ICD-10-CM | POA: Diagnosis not present

## 2020-03-01 DIAGNOSIS — N6313 Unspecified lump in the right breast, lower outer quadrant: Secondary | ICD-10-CM | POA: Diagnosis not present

## 2020-03-01 DIAGNOSIS — Z85820 Personal history of malignant melanoma of skin: Secondary | ICD-10-CM | POA: Diagnosis not present

## 2020-03-01 DIAGNOSIS — N6310 Unspecified lump in the right breast, unspecified quadrant: Secondary | ICD-10-CM | POA: Diagnosis not present

## 2020-03-01 DIAGNOSIS — C50511 Malignant neoplasm of lower-outer quadrant of right female breast: Secondary | ICD-10-CM | POA: Diagnosis not present

## 2020-03-01 DIAGNOSIS — D179 Benign lipomatous neoplasm, unspecified: Secondary | ICD-10-CM | POA: Insufficient documentation

## 2020-03-01 DIAGNOSIS — D241 Benign neoplasm of right breast: Secondary | ICD-10-CM | POA: Diagnosis not present

## 2020-03-01 DIAGNOSIS — Z86718 Personal history of other venous thrombosis and embolism: Secondary | ICD-10-CM | POA: Diagnosis not present

## 2020-03-01 DIAGNOSIS — Z79899 Other long term (current) drug therapy: Secondary | ICD-10-CM | POA: Diagnosis not present

## 2020-03-01 DIAGNOSIS — E669 Obesity, unspecified: Secondary | ICD-10-CM | POA: Diagnosis not present

## 2020-03-01 DIAGNOSIS — M1990 Unspecified osteoarthritis, unspecified site: Secondary | ICD-10-CM | POA: Diagnosis not present

## 2020-03-09 DIAGNOSIS — N6310 Unspecified lump in the right breast, unspecified quadrant: Secondary | ICD-10-CM | POA: Diagnosis not present

## 2020-03-15 ENCOUNTER — Encounter: Payer: PPO | Admitting: Family Medicine

## 2020-03-19 ENCOUNTER — Ambulatory Visit: Payer: PPO

## 2020-03-20 DIAGNOSIS — C50911 Malignant neoplasm of unspecified site of right female breast: Secondary | ICD-10-CM | POA: Diagnosis not present

## 2020-03-21 ENCOUNTER — Encounter: Payer: Self-pay | Admitting: Family Medicine

## 2020-03-21 ENCOUNTER — Ambulatory Visit (INDEPENDENT_AMBULATORY_CARE_PROVIDER_SITE_OTHER): Payer: PPO | Admitting: Family Medicine

## 2020-03-21 ENCOUNTER — Other Ambulatory Visit: Payer: Self-pay

## 2020-03-21 VITALS — BP 130/84 | HR 64 | Temp 98.1°F | Ht 67.0 in | Wt 231.0 lb

## 2020-03-21 DIAGNOSIS — R93421 Abnormal radiologic findings on diagnostic imaging of right kidney: Secondary | ICD-10-CM | POA: Diagnosis not present

## 2020-03-21 DIAGNOSIS — R918 Other nonspecific abnormal finding of lung field: Secondary | ICD-10-CM | POA: Diagnosis not present

## 2020-03-21 DIAGNOSIS — E785 Hyperlipidemia, unspecified: Secondary | ICD-10-CM | POA: Diagnosis not present

## 2020-03-21 DIAGNOSIS — I2510 Atherosclerotic heart disease of native coronary artery without angina pectoris: Secondary | ICD-10-CM | POA: Diagnosis not present

## 2020-03-21 DIAGNOSIS — D1809 Hemangioma of other sites: Secondary | ICD-10-CM | POA: Diagnosis not present

## 2020-03-21 DIAGNOSIS — Z0001 Encounter for general adult medical examination with abnormal findings: Secondary | ICD-10-CM | POA: Diagnosis not present

## 2020-03-21 DIAGNOSIS — C50919 Malignant neoplasm of unspecified site of unspecified female breast: Secondary | ICD-10-CM | POA: Insufficient documentation

## 2020-03-21 DIAGNOSIS — I700 Atherosclerosis of aorta: Secondary | ICD-10-CM | POA: Diagnosis not present

## 2020-03-21 DIAGNOSIS — C499 Malignant neoplasm of connective and soft tissue, unspecified: Secondary | ICD-10-CM | POA: Diagnosis not present

## 2020-03-21 DIAGNOSIS — E079 Disorder of thyroid, unspecified: Secondary | ICD-10-CM | POA: Diagnosis not present

## 2020-03-21 DIAGNOSIS — E669 Obesity, unspecified: Secondary | ICD-10-CM | POA: Diagnosis not present

## 2020-03-21 DIAGNOSIS — R93422 Abnormal radiologic findings on diagnostic imaging of left kidney: Secondary | ICD-10-CM | POA: Diagnosis not present

## 2020-03-21 MED ORDER — WEGOVY 0.25 MG/0.5ML ~~LOC~~ SOAJ: 0.2500 mg | SUBCUTANEOUS | 0 refills | Status: DC

## 2020-03-21 NOTE — Assessment & Plan Note (Signed)
Continue lifestyle modifications. Check lipids next blood draw.

## 2020-03-21 NOTE — Assessment & Plan Note (Signed)
Recently diagnosed. Will be having CT scan later today and will be following up with oncology. May need mastectomy.

## 2020-03-21 NOTE — Assessment & Plan Note (Signed)
Will start wegovy. Discussed side effects. She will check in with me in a motnh.

## 2020-03-21 NOTE — Progress Notes (Signed)
Chief Complaint:  Kathy Joseph is a 64 y.o. female who presents today for her annual comprehensive physical exam.    Assessment/Plan:  Chronic Problems Addressed Today: Obesity Will start wegovy. Discussed side effects. She will check in with me in a motnh.   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 Will be having CT scan later today and will be following up with oncology. May need mastectomy.   Dyslipidemia Continue lifestyle modifications. Check lipids next blood draw.   Preventative Healthcare: Will get tdap at pharmacy. Has received covid vaccines. UTD on other cancer screenings.   Patient Counseling(The following topics were reviewed and/or handout was given):  -Nutrition: Stressed importance of moderation in sodium/caffeine intake, saturated fat and cholesterol, caloric balance, sufficient intake of fresh fruits, vegetables, and fiber.  -Stressed the importance of regular exercise.   -Substance Abuse: Discussed cessation/primary prevention of tobacco, alcohol, or other drug use; driving or other dangerous activities under the influence; availability of treatment for abuse.   -Injury prevention: Discussed safety belts, safety helmets, smoke detector, smoking near bedding or upholstery.   -Sexuality: Discussed sexually transmitted diseases, partner selection, use of condoms, avoidance of unintended pregnancy and contraceptive alternatives.   -Dental health: Discussed importance of regular tooth brushing, flossing, and dental visits.  -Health maintenance and immunizations reviewed. Please refer to Health maintenance section.  Return to care in 1 year for next preventative visit.     Subjective:  HPI:  Recently diagnosed with breast angiosarcoma. Will be following up with oncology.   Lifestyle  Diet: Cutting down on red meats and carbs.   Depression screen PHQ 2/9 03/14/2019  Decreased Interest 0  Down, Depressed, Hopeless 0  PHQ - 2 Score 0    Health  Maintenance Due  Topic Date Due  . TETANUS/TDAP  Never done  . PNA vac Low Risk Adult (2 of 2 - PCV13) 03/13/2020     ROS: Per HPI, otherwise a complete review of systems was negative.   PMH:  The following were reviewed and entered/updated in epic: Past Medical History:  Diagnosis Date  . Arthritis   . Atypical nevus 06/11/2018   below left ear, moderate atypia  . Atypical nevus 06/11/2018   right mid forehead, moderate to severe (wider shave done 07/08/2018)  . Melanoma ([place]) 01/20/2019   melanoma in situ on right distal calf TX on 07/29/202  . Varicose veins of lower extremity    Patient Active Problem List   Diagnosis Date Noted  . 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 Obesity 03/21/2020  . Dyslipidemia 03/15/2019  . Precancerous skin lesion 03/10/2018  . Hirsutism 03/10/2018  . Varicose veins of lower extremity 03/10/2018  . Arthritis 03/10/2018   Past Surgical History:  Procedure Laterality Date  . ECTOPIC PREGNANCY SURGERY  1986  . KNEE SURGERY Right   . TONSILLECTOMY AND ADENOIDECTOMY      Family History  Problem Relation Age of Onset  . Breast cancer Mother   . Colon cancer Father        Diagnosed in 68s.   . Breast cancer Sister   . Breast cancer Paternal Aunt   . Breast cancer Maternal Aunt     Medications- reviewed and updated Current Outpatient Medications  Medication Sig Dispense Refill  . Cholecalciferol (VITAMIN D3) 10 MCG (400 UNIT) tablet Take 400 Units by mouth daily.    . Eflornithine HCl ([name]) 13.9 % cream Apply 1 application topically daily. 45 g 2  . Magnesium 250 MG TABS Take  by mouth.    . Misc Natural Products (GLUCOSAMINE CHOND MSM FORMULA PO) Take by mouth.    . multivitamin-lutein (OCUVITE-LUTEIN) CAPS capsule Take 1 capsule by mouth daily.    . Probiotic Product (PROBIOTIC PEARLS ADVANTAGE PO) Take by mouth.    . Semaglutide-Weight Management (WEGOVY) 0.25 MG/0.5ML SOAJ Inject 0.5 mLs (0.25 mg total) into the skin once a  week. 2 mL 0   No current facility-administered medications for this visit.    Allergies-reviewed and updated Allergies  Allergen Reactions  . Cyclosporine Rash  . Cephalosporins Hives    Social History   Socioeconomic History  . Marital status: Married    Spouse name: Not on file  . Number of children: Not on file  . Years of education: Not on file  . Highest education level: Not on file  Occupational History  . Not on file  Tobacco Use  . Smoking status: Never Smoker  . Smokeless tobacco: Never Used  Vaping Use  . Vaping Use: Never used  Substance and Sexual Activity  . Alcohol use: Yes    Alcohol/week: 3.0 standard drinks    Types: 3 Glasses of wine per week  . Drug use: Never  . Sexual activity: Yes    Partners: Male  Other Topics Concern  . Not on file  Social History Narrative  . Not on file   Social Determinants of Health   Financial Resource Strain:   . Difficulty of Paying Living Expenses: Not on file  Food Insecurity:   . Worried About Charity fundraiser in the Last Year: Not on file  . Ran Out of Food in the Last Year: Not on file  Transportation Needs:   . Lack of Transportation (Medical): Not on file  . Lack of Transportation (Non-Medical): Not on file  Physical Activity:   . Days of Exercise per Week: Not on file  . Minutes of Exercise per Session: Not on file  Stress:   . Feeling of Stress : Not on file  Social Connections:   . Frequency of Communication with Friends and Family: Not on file  . Frequency of Social Gatherings with Friends and Family: Not on file  . Attends Religious Services: Not on file  . Active Member of Clubs or Organizations: Not on file  . Attends Archivist Meetings: Not on file  . Marital Status: Not on file        Objective:  Physical Exam: BP 130/84   Pulse 64   Temp 98.1 F (36.7 C) (Temporal)   Ht 5\' 7"  (1.702 m)   Wt 231 lb (104.8 kg)   SpO2 99%   BMI 36.18 kg/m   Body mass index is 36.18  kg/m. Wt Readings from Last 3 Encounters:  03/21/20 231 lb (104.8 kg)  03/14/19 236 lb 3.2 oz (107.1 kg)  09/28/18 245 lb 4 oz (111.2 kg)   Gen: NAD, resting comfortably HEENT: TMs normal bilaterally. OP clear. No thyromegaly noted.  CV: RRR with no murmurs appreciated Pulm: NWOB, CTAB with no crackles, wheezes, or rhonchi GI: Normal bowel sounds present. Soft, Nontender, Nondistended. MSK: no edema, cyanosis, or clubbing noted Skin: warm, dry Neuro: CN2-12 grossly intact. Strength 5/5 in upper and lower extremities. Reflexes symmetric and intact bilaterally.  Psych: Normal affect and thought content     Benita Boonstra M. Jerline Pain, MD 03/21/2020 10:49 AM

## 2020-03-21 NOTE — Patient Instructions (Addendum)
It was very nice to see you today!  Please start the wegovy. Let me know if insurance will not pay for it. Please send me a message in about a month after starting to let me know how it is going.   I will see you back in a year for you next physical. Please come back to see me sooner if needed.   Take care, Dr Jerline Pain  Please try these tips to maintain a healthy lifestyle:   Eat at least 3 REAL meals and 1-2 snacks per day.  Aim for no more than 5 hours between eating.  If you eat breakfast, please do so within one hour of getting up.    Each meal should contain half fruits/vegetables, one quarter protein, and one quarter carbs (no bigger than a computer mouse)   Cut down on sweet beverages. This includes juice, soda, and sweet tea.     Drink at least 1 glass of water with each meal and aim for at least 8 glasses per day   Exercise at least 150 minutes every week.    Preventive Care 11 Years and Older, Female Preventive care refers to lifestyle choices and visits with your health care provider that can promote health and wellness. This includes:  A yearly physical exam. This is also called an annual well check.  Regular dental and eye exams.  Immunizations.  Screening for certain conditions.  Healthy lifestyle choices, such as diet and exercise. What can I expect for my preventive care visit? Physical exam Your health care provider will check:  Height and weight. These may be used to calculate body mass index (BMI), which is a measurement that tells if you are at a healthy weight.  Heart rate and blood pressure.  Your skin for abnormal spots. Counseling Your health care provider may ask you questions about:  Alcohol, tobacco, and drug use.  Emotional well-being.  Home and relationship well-being.  Sexual activity.  Eating habits.  History of falls.  Memory and ability to understand (cognition).  Work and work Statistician.  Pregnancy and menstrual  history. What immunizations do I need?  Influenza (flu) vaccine  This is recommended every year. Tetanus, diphtheria, and pertussis (Tdap) vaccine  You may need a Td booster every 10 years. Varicella (chickenpox) vaccine  You may need this vaccine if you have not already been vaccinated. Zoster (shingles) vaccine  You may need this after age 66. Pneumococcal conjugate (PCV13) vaccine  One dose is recommended after age 60. Pneumococcal polysaccharide (PPSV23) vaccine  One dose is recommended after age 3. Measles, mumps, and rubella (MMR) vaccine  You may need at least one dose of MMR if you were born in 1957 or later. You may also need a second dose. Meningococcal conjugate (MenACWY) vaccine  You may need this if you have certain conditions. Hepatitis A vaccine  You may need this if you have certain conditions or if you travel or work in places where you may be exposed to hepatitis A. Hepatitis B vaccine  You may need this if you have certain conditions or if you travel or work in places where you may be exposed to hepatitis B. Haemophilus influenzae type b (Hib) vaccine  You may need this if you have certain conditions. You may receive vaccines as individual doses or as more than one vaccine together in one shot (combination vaccines). Talk with your health care provider about the risks and benefits of combination vaccines. What tests do I need? Blood  tests  Lipid and cholesterol levels. These may be checked every 5 years, or more frequently depending on your overall health.  Hepatitis C test.  Hepatitis B test. Screening  Lung cancer screening. You may have this screening every year starting at age 88 if you have a 30-pack-year history of smoking and currently smoke or have quit within the past 15 years.  Colorectal cancer screening. All adults should have this screening starting at age 34 and continuing until age 35. Your health care provider may recommend  screening at age 66 if you are at increased risk. You will have tests every 1-10 years, depending on your results and the type of screening test.  Diabetes screening. This is done by checking your blood sugar (glucose) after you have not eaten for a while (fasting). You may have this done every 1-3 years.  Mammogram. This may be done every 1-2 years. Talk with your health care provider about how often you should have regular mammograms.  BRCA-related cancer screening. This may be done if you have a family history of breast, ovarian, tubal, or peritoneal cancers. Other tests  Sexually transmitted disease (STD) testing.  Bone density scan. This is done to screen for osteoporosis. You may have this done starting at age 13. Follow these instructions at home: Eating and drinking  Eat a diet that includes fresh fruits and vegetables, whole grains, lean protein, and low-fat dairy products. Limit your intake of foods with high amounts of sugar, saturated fats, and salt.  Take vitamin and mineral supplements as recommended by your health care provider.  Do not drink alcohol if your health care provider tells you not to drink.  If you drink alcohol: ? Limit how much you have to 0-1 drink a day. ? Be aware of how much alcohol is in your drink. In the [place] one drink equals one 12 oz bottle of beer (355 mL), one 5 oz glass of wine (148 mL), or one 1 oz glass of hard liquor (44 mL). Lifestyle  Take daily care of your teeth and gums.  Stay active. Exercise for at least 30 minutes on 5 or more days each week.  Do not use any products that contain nicotine or tobacco, such as cigarettes, e-cigarettes, and chewing tobacco. If you need help quitting, ask your health care provider.  If you are sexually active, practice safe sex. Use a condom or other form of protection in order to prevent STIs (sexually transmitted infections).  Talk with your health care provider about taking a low-dose aspirin or  statin. What's next?  Go to your health care provider once a year for a well check visit.  Ask your health care provider how often you should have your eyes and teeth checked.  Stay up to date on all vaccines. This information is not intended to replace advice given to you by your health care provider. Make sure you discuss any questions you have with your health care provider. Document Revised: 07/01/2018 Document Reviewed: 07/01/2018 Elsevier Patient Education  2020 [employer].

## 2020-03-22 ENCOUNTER — Ambulatory Visit (INDEPENDENT_AMBULATORY_CARE_PROVIDER_SITE_OTHER): Payer: PPO

## 2020-03-22 DIAGNOSIS — Z0000 Encounter for general adult medical examination without abnormal findings: Secondary | ICD-10-CM

## 2020-03-22 NOTE — Progress Notes (Signed)
Virtual Visit via Telephone Note  I connected with  Sanaiya Welliver on 03/22/20 at  9:30 AM EDT by telephone and verified that I am speaking with the correct person using two identifiers.  Medicare Annual Wellness visit completed telephonically due to Covid-19 pandemic.   Persons participating in this call: This Health Coach and this patient.   Location: Patient: Home Provider: Office   I discussed the limitations, risks, security and privacy concerns of performing an evaluation and management service by telephone and the availability of in person appointments. The patient expressed understanding and agreed to proceed.  Unable to perform video visit due to video visit attempted and failed and/or patient does not have video capability.   Some vital signs may be absent or patient reported.   Willette Brace, LPN    Subjective:   Jaeley Wiker is a 64 y.o. female who presents for Medicare Annual (Subsequent) preventive examination.  Review of Systems     Cardiac Risk Factors include: advanced age (>96men, >81 women);obesity (BMI >30kg/m2);dyslipidemia     Objective:    There were no vitals filed for this visit. There is no height or weight on file to calculate BMI.  Advanced Directives 03/22/2020  Does Patient Have a Medical Advance Directive? No  Would patient like information on creating a medical advance directive? Yes (MAU/Ambulatory/Procedural Areas - Information given)    Current Medications (verified) Outpatient Encounter Medications as of 03/22/2020  Medication Sig  . Cholecalciferol (VITAMIN D3) 10 MCG (400 UNIT) tablet Take 400 Units by mouth daily.  . Eflornithine HCl ([name]) 13.9 % cream Apply 1 application topically daily.  . Magnesium 250 MG TABS Take by mouth.  . Misc Natural Products (GLUCOSAMINE CHOND MSM FORMULA PO) Take by mouth.  . multivitamin-lutein (OCUVITE-LUTEIN) CAPS capsule Take 1 capsule by mouth daily.  . Probiotic Product (PROBIOTIC PEARLS  ADVANTAGE PO) Take by mouth.  . Semaglutide-Weight Management (WEGOVY) 0.25 MG/0.5ML SOAJ Inject 0.5 mLs (0.25 mg total) into the skin once a week. (Patient not taking: Reported on 03/22/2020)   No facility-administered encounter medications on file as of 03/22/2020.    Allergies (verified) Cephalosporins   History: Past Medical History:  Diagnosis Date  . Arthritis   . Atypical nevus 06/11/2018   below left ear, moderate atypia  . Atypical nevus 06/11/2018   right mid forehead, moderate to severe (wider shave done 07/08/2018)  . Melanoma ([place]) 01/20/2019   melanoma in situ on right distal calf TX on 07/29/202  . Varicose veins of lower extremity    Past Surgical History:  Procedure Laterality Date  . ECTOPIC PREGNANCY SURGERY  1986  . KNEE SURGERY Right   . TONSILLECTOMY AND ADENOIDECTOMY     Family History  Problem Relation Age of Onset  . Breast cancer Mother   . Colon cancer Father        Diagnosed in 62s.   . Breast cancer Sister   . Breast cancer Paternal Aunt   . Breast cancer Maternal Aunt    Social History   Socioeconomic History  . Marital status: Married    Spouse name: Not on file  . Number of children: Not on file  . Years of education: Not on file  . Highest education level: Not on file  Occupational History  . Occupation: Retired  Tobacco Use  . Smoking status: Never Smoker  . Smokeless tobacco: Never Used  Vaping Use  . Vaping Use: Never used  Substance and Sexual Activity  . Alcohol use:  Yes    Alcohol/week: 3.0 standard drinks    Types: 3 Glasses of wine per week  . Drug use: Never  . Sexual activity: Yes    Partners: Male  Other Topics Concern  . Not on file  Social History Narrative  . Not on file   Social Determinants of Health   Financial Resource Strain: Low Risk   . Difficulty of Paying Living Expenses: Not hard at all  Food Insecurity: No Food Insecurity  . Worried About Charity fundraiser in the Last Year: Never true  . Ran  Out of Food in the Last Year: Never true  Transportation Needs: No Transportation Needs  . Lack of Transportation (Medical): No  . Lack of Transportation (Non-Medical): No  Physical Activity: Inactive  . Days of Exercise per Week: 0 days  . Minutes of Exercise per Session: 0 min  Stress: Stress Concern Present  . Feeling of Stress : To some extent  Social Connections: Socially Integrated  . Frequency of Communication with Friends and Family: More than three times a week  . Frequency of Social Gatherings with Friends and Family: Once a week  . Attends Religious Services: More than 4 times per year  . Active Member of Clubs or Organizations: Yes  . Attends Archivist Meetings: Never  . Marital Status: Married    Tobacco Counseling Counseling given: Not Answered   Clinical Intake:  Pre-visit preparation completed: Yes  Pain : No/denies pain     BMI - recorded: 36.81 Nutritional Status: BMI > 30  Obese Nutritional Risks: None Diabetes: No  How often do you need to have someone help you when you read instructions, pamphlets, or other written materials from your doctor or pharmacy?: 1 - Never  Diabetic?No  Interpreter Needed?: No  Information entered by :: Charlott Rakes ,LPN   Activities of Daily Living In your present state of health, do you have any difficulty performing the following activities: 03/22/2020  Hearing? N  Vision? N  Difficulty concentrating or making decisions? N  Walking or climbing stairs? N  Dressing or bathing? N  Doing errands, shopping? N  Preparing Food and eating ? N  Using the Toilet? N  In the past six months, have you accidently leaked urine? N  Do you have problems with loss of bowel control? N  Managing your Medications? N  Managing your Finances? N  Housekeeping or managing your Housekeeping? N  Some recent data might be hidden    Patient Care Team: Vivi Barrack, MD as PCP - General (Family Medicine)  Indicate any  recent Medical Services you may have received from other than [facility] providers in the past year (date may be approximate).     Assessment:   This is a routine wellness examination for [place].  Hearing/Vision screen  Hearing Screening   125Hz  250Hz  500Hz  1000Hz  2000Hz  3000Hz  4000Hz  6000Hz  8000Hz   Right ear:           Left ear:           Comments: Pt denies any difficulty hearing at this time  Vision Screening Comments: Follows up with Dr Ernestine Mcmurray at [facility]  Dietary issues and exercise activities discussed: Current Exercise Habits: The patient does not participate in regular exercise at present  Goals    . Patient Stated     Losing weight and manage it better      Depression Screen PHQ 2/9 Scores 03/22/2020 03/14/2019 03/10/2018  PHQ - 2  Score 1 0 0    Fall Risk Fall Risk  03/22/2020 06/15/2019 03/14/2019 03/10/2018  Falls in the past year? 0 0 0 No  Comment - Emmi Telephone Survey: data to providers prior to load - -  Number falls in past yr: 0 - - -  Injury with Fall? 0 - - -  Risk for fall due to : Impaired vision - - -  Follow up Falls prevention discussed - - -    Any stairs in or around the home? Yes  If so, are there any without handrails? No  Home free of loose throw rugs in walkways, pet beds, electrical cords, etc? Yes  Adequate lighting in your home to reduce risk of falls? Yes   ASSISTIVE DEVICES UTILIZED TO PREVENT FALLS:  Life alert? No  Use of a cane, walker or w/c? No  Grab bars in the bathroom? No  Shower chair or bench in shower? No  Elevated toilet seat or a handicapped toilet? No   TIMED UP AND GO:  Was the test performed? No .   Cognitive Function:     6CIT Screen 03/22/2020  What Year? 0 points  What month? 0 points  Count back from 20 0 points  Months in reverse 0 points  Repeat phrase 0 points    Immunizations Immunization History  Administered Date(s) Administered  . Fluad Quad(high Dose 65+) 03/14/2019  .  Influenza,inj,Quad PF,6+ Mos 05/04/2018  . Moderna SARS-COVID-2 Vaccination 08/24/2019, 09/22/2019  . Pneumococcal Polysaccharide-23 03/14/2019    TDAP status: Due, Education has been provided regarding the importance of this vaccine. Advised may receive this vaccine at local pharmacy or Health Dept. Aware to provide a copy of the vaccination record if obtained from local pharmacy or Health Dept. Verbalized acceptance and understanding. Flu Vaccine status: Up to date Pneumococcal vaccine status: Declined,  Education has been provided regarding the importance of this vaccine but patient still declined. Advised may receive this vaccine at local pharmacy or Health Dept. Aware to provide a copy of the vaccination record if obtained from local pharmacy or Health Dept. Verbalized acceptance and understanding.  Covid-19 vaccine status: Completed vaccines  Qualifies for Shingles Vaccine? Yes   Zostavax completed No   Shingrix Completed?: No.    Education has been provided regarding the importance of this vaccine. Patient has been advised to call insurance company to determine out of pocket expense if they have not yet received this vaccine. Advised may also receive vaccine at local pharmacy or Health Dept. Verbalized acceptance and understanding.  Screening Tests Health Maintenance  Topic Date Due  . TETANUS/TDAP  Never done  . PNA vac Low Risk Adult (2 of 2 - PCV13) 03/13/2020  . INFLUENZA VACCINE  10/18/2020 (Originally 02/19/2020)  . MAMMOGRAM  05/16/2021  . COLONOSCOPY  01/27/2027  . DEXA SCAN  Completed  . COVID-19 Vaccine  Completed  . Hepatitis C Screening  Completed    Health Maintenance  Health Maintenance Due  Topic Date Due  . TETANUS/TDAP  Never done  . PNA vac Low Risk Adult (2 of 2 - PCV13) 03/13/2020    Colorectal cancer screening: Completed 01/26/17. Repeat every 10 years Mammogram status: Completed 12/12/19. Repeat every year Bone Density status: Completed 03/30/19. Results  reflect: Bone density results: NORMAL. Repeat every 2 years.    Additional Screening:  Hepatitis C Screening: Completed 03/14/19  Vision Screening: Recommended annual ophthalmology exams for early detection of glaucoma and other disorders of the eye. Is the patient  up to date with their annual eye exam?  Yes  Who is the provider or what is the name of the office in which the patient attends annual eye exams? Dr Ernestine Mcmurray  Dental Screening: Recommended annual dental exams for proper oral hygiene  Community Resource Referral / Chronic Care Management: CRR required this visit?  No   CCM required this visit?  No      Plan:     I have personally reviewed and noted the following in the patient's chart:   . Medical and social history . Use of alcohol, tobacco or illicit drugs  . Current medications and supplements . Functional ability and status . Nutritional status . Physical activity . Advanced directives . List of other physicians . Hospitalizations, surgeries, and ER visits in previous 12 months . Vitals . Screenings to include cognitive, depression, and falls . Referrals and appointments  In addition, I have reviewed and discussed with patient certain preventive protocols, quality metrics, and best practice recommendations. A written personalized care plan for preventive services as well as general preventive health recommendations were provided to patient.     Willette Brace, LPN   07/26/1094   Nurse Notes: None

## 2020-03-22 NOTE — Patient Instructions (Addendum)
Kathy Joseph , Thank you for taking time to come for your Medicare Wellness Visit. I appreciate your ongoing commitment to your health goals. Please review the following plan we discussed and let me know if I can assist you in the future.   Screening recommendations/referrals: Colonoscopy: Done 01/26/17 Mammogram: Done 12/12/19 Bone Density: Done 03/30/19 Recommended yearly ophthalmology/optometry visit for glaucoma screening and checkup Recommended yearly dental visit for hygiene and checkup  Vaccinations: Influenza vaccine: Up to date Pneumococcal vaccine: Due and discussed Tdap vaccine: Due and discussed Shingles vaccine: Shingrix discussed. Please contact your pharmacy for coverage information.  Covid-19:Completed 2/3 & 09/22/19  Advanced directives: Advance directive discussed with you today. I have provided a copy for you to complete at home and have notarized. Once this is complete please bring a copy in to our office so we can scan it into your chart.  Conditions/risks identified: Lose weight   Next appointment: Follow up in one year for your annual wellness visit    Preventive Care 65 Years and Older, Female Preventive care refers to lifestyle choices and visits with your health care provider that can promote health and wellness. What does preventive care include?  A yearly physical exam. This is also called an annual well check.  Dental exams once or twice a year.  Routine eye exams. Ask your health care provider how often you should have your eyes checked.  Personal lifestyle choices, including:  Daily care of your teeth and gums.  Regular physical activity.  Eating a healthy diet.  Avoiding tobacco and drug use.  Limiting alcohol use.  Practicing safe sex.  Taking low-dose aspirin every day.  Taking vitamin and mineral supplements as recommended by your health care provider. What happens during an annual well check? The services and screenings done by your  health care provider during your annual well check will depend on your age, overall health, lifestyle risk factors, and family history of disease. Counseling  Your health care provider may ask you questions about your:  Alcohol use.  Tobacco use.  Drug use.  Emotional well-being.  Home and relationship well-being.  Sexual activity.  Eating habits.  History of falls.  Memory and ability to understand (cognition).  Work and work Statistician.  Reproductive health. Screening  You may have the following tests or measurements:  Height, weight, and BMI.  Blood pressure.  Lipid and cholesterol levels. These may be checked every 5 years, or more frequently if you are over 51 years old.  Skin check.  Lung cancer screening. You may have this screening every year starting at age 90 if you have a 30-pack-year history of smoking and currently smoke or have quit within the past 15 years.  Fecal occult blood test (FOBT) of the stool. You may have this test every year starting at age 44.  Flexible sigmoidoscopy or colonoscopy. You may have a sigmoidoscopy every 5 years or a colonoscopy every 10 years starting at age 21.  Hepatitis C blood test.  Hepatitis B blood test.  Sexually transmitted disease (STD) testing.  Diabetes screening. This is done by checking your blood sugar (glucose) after you have not eaten for a while (fasting). You may have this done every 1-3 years.  Bone density scan. This is done to screen for osteoporosis. You may have this done starting at age 59.  Mammogram. This may be done every 1-2 years. Talk to your health care provider about how often you should have regular mammograms. Talk with your health  care provider about your test results, treatment options, and if necessary, the need for more tests. Vaccines  Your health care provider may recommend certain vaccines, such as:  Influenza vaccine. This is recommended every year.  Tetanus, diphtheria, and  acellular pertussis (Tdap, Td) vaccine. You may need a Td booster every 10 years.  Zoster vaccine. You may need this after age 16.  Pneumococcal 13-valent conjugate (PCV13) vaccine. One dose is recommended after age 35.  Pneumococcal polysaccharide (PPSV23) vaccine. One dose is recommended after age 65. Talk to your health care provider about which screenings and vaccines you need and how often you need them. This information is not intended to replace advice given to you by your health care provider. Make sure you discuss any questions you have with your health care provider. Document Released: 08/03/2015 Document Revised: 03/26/2016 Document Reviewed: 05/08/2015 Elsevier Interactive Patient Education  2017 [employer] Prevention in the Home Falls can cause injuries. They can happen to people of all ages. There are many things you can do to make your home safe and to help prevent falls. What can I do on the outside of my home?  Regularly fix the edges of walkways and driveways and fix any cracks.  Remove anything that might make you trip as you walk through a door, such as a raised step or threshold.  Trim any bushes or trees on the path to your home.  Use bright outdoor lighting.  Clear any walking paths of anything that might make someone trip, such as rocks or tools.  Regularly check to see if handrails are loose or broken. Make sure that both sides of any steps have handrails.  Any raised decks and porches should have guardrails on the edges.  Have any leaves, snow, or ice cleared regularly.  Use sand or salt on walking paths during winter.  Clean up any spills in your garage right away. This includes oil or grease spills. What can I do in the bathroom?  Use night lights.  Install grab bars by the toilet and in the tub and shower. Do not use towel bars as grab bars.  Use non-skid mats or decals in the tub or shower.  If you need to sit down in the shower, use  a plastic, non-slip stool.  Keep the floor dry. Clean up any water that spills on the floor as soon as it happens.  Remove soap buildup in the tub or shower regularly.  Attach bath mats securely with double-sided non-slip rug tape.  Do not have throw rugs and other things on the floor that can make you trip. What can I do in the bedroom?  Use night lights.  Make sure that you have a light by your bed that is easy to reach.  Do not use any sheets or blankets that are too big for your bed. They should not hang down onto the floor.  Have a firm chair that has side arms. You can use this for support while you get dressed.  Do not have throw rugs and other things on the floor that can make you trip. What can I do in the kitchen?  Clean up any spills right away.  Avoid walking on wet floors.  Keep items that you use a lot in easy-to-reach places.  If you need to reach something above you, use a strong step stool that has a grab bar.  Keep electrical cords out of the way.  Do not use floor  polish or wax that makes floors slippery. If you must use wax, use non-skid floor wax.  Do not have throw rugs and other things on the floor that can make you trip. What can I do with my stairs?  Do not leave any items on the stairs.  Make sure that there are handrails on both sides of the stairs and use them. Fix handrails that are broken or loose. Make sure that handrails are as long as the stairways.  Check any carpeting to make sure that it is firmly attached to the stairs. Fix any carpet that is loose or worn.  Avoid having throw rugs at the top or bottom of the stairs. If you do have throw rugs, attach them to the floor with carpet tape.  Make sure that you have a light switch at the top of the stairs and the bottom of the stairs. If you do not have them, ask someone to add them for you. What else can I do to help prevent falls?  Wear shoes that:  Do not have high heels.  Have  rubber bottoms.  Are comfortable and fit you well.  Are closed at the toe. Do not wear sandals.  If you use a stepladder:  Make sure that it is fully opened. Do not climb a closed stepladder.  Make sure that both sides of the stepladder are locked into place.  Ask someone to hold it for you, if possible.  Clearly mark and make sure that you can see:  Any grab bars or handrails.  First and last steps.  Where the edge of each step is.  Use tools that help you move around (mobility aids) if they are needed. These include:  Canes.  Walkers.  Scooters.  Crutches.  Turn on the lights when you go into a dark area. Replace any light bulbs as soon as they burn out.  Set up your furniture so you have a clear path. Avoid moving your furniture around.  If any of your floors are uneven, fix them.  If there are any pets around you, be aware of where they are.  Review your medicines with your doctor. Some medicines can make you feel dizzy. This can increase your chance of falling. Ask your doctor what other things that you can do to help prevent falls. This information is not intended to replace advice given to you by your health care provider. Make sure you discuss any questions you have with your health care provider. Document Released: 05/03/2009 Document Revised: 12/13/2015 Document Reviewed: 08/11/2014 Elsevier Interactive Patient Education  2017 [employer].

## 2020-03-27 DIAGNOSIS — C50919 Malignant neoplasm of unspecified site of unspecified female breast: Secondary | ICD-10-CM | POA: Diagnosis not present

## 2020-04-02 ENCOUNTER — Ambulatory Visit: Payer: PPO | Admitting: Physician Assistant

## 2020-04-02 ENCOUNTER — Encounter: Payer: Self-pay | Admitting: Physician Assistant

## 2020-04-02 ENCOUNTER — Other Ambulatory Visit: Payer: Self-pay

## 2020-04-02 DIAGNOSIS — D485 Neoplasm of uncertain behavior of skin: Secondary | ICD-10-CM | POA: Diagnosis not present

## 2020-04-02 DIAGNOSIS — Z86006 Personal history of melanoma in-situ: Secondary | ICD-10-CM

## 2020-04-02 DIAGNOSIS — D225 Melanocytic nevi of trunk: Secondary | ICD-10-CM | POA: Diagnosis not present

## 2020-04-02 DIAGNOSIS — L821 Other seborrheic keratosis: Secondary | ICD-10-CM | POA: Diagnosis not present

## 2020-04-02 DIAGNOSIS — Z1283 Encounter for screening for malignant neoplasm of skin: Secondary | ICD-10-CM

## 2020-04-02 NOTE — Patient Instructions (Signed)

## 2020-04-02 NOTE — Progress Notes (Signed)
   Follow up Visit  Subjective  Kathy Joseph is a 64 y.o. 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 She is waiting for final pathology results. ). No skin concerns at this time.   Objective  Well appearing patient in no apparent distress; mood and affect are within normal limits.  A full examination was performed including scalp, head, eyes, ears, nose, lips, neck, chest, axillae, abdomen, back, buttocks, bilateral upper extremities, bilateral lower extremities, hands, feet, fingers, toes, fingernails, and toenails. All findings within normal limits unless otherwise noted below.   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  Neoplasm of uncertain behavior of skin (2) Left sholder psoterior  Skin / nail biopsy Type of biopsy: tangential   Informed consent: discussed and consent obtained   Timeout: patient name, date of birth, surgical site, and procedure verified   Procedure prep:  Patient was prepped and draped in usual sterile fashion (Non sterile) Prep type:  Chlorhexidine Anesthesia: the lesion was anesthetized in a standard fashion   Anesthetic:  1% lidocaine w/ epinephrine 1-100,000 local infiltration Instrument used: flexible razor blade   Outcome: patient tolerated procedure well   Post-procedure details: wound care instructions given    Specimen 1 - Surgical pathology Differential Diagnosis: atypia Check Margins: No  Right Inguinal Area  Skin / nail biopsy Type of biopsy: tangential   Informed consent: discussed and consent obtained   Timeout: patient name, date of birth, surgical site, and procedure verified   Procedure prep:  Patient was prepped  and draped in usual sterile fashion (Non sterile) Prep type:  Chlorhexidine Anesthesia: the lesion was anesthetized in a standard fashion   Anesthetic:  1% lidocaine w/ epinephrine 1-100,000 local infiltration Instrument used: flexible razor blade   Outcome: patient tolerated procedure well   Post-procedure details: wound care instructions given    Specimen 2 - Surgical pathology Differential Diagnosis: atypia Check Margins: No

## 2020-04-03 DIAGNOSIS — Z800 Family history of malignant neoplasm of digestive organs: Secondary | ICD-10-CM | POA: Diagnosis not present

## 2020-04-03 DIAGNOSIS — Z85820 Personal history of malignant melanoma of skin: Secondary | ICD-10-CM | POA: Diagnosis not present

## 2020-04-03 DIAGNOSIS — C50411 Malignant neoplasm of upper-outer quadrant of right female breast: Secondary | ICD-10-CM | POA: Diagnosis not present

## 2020-04-03 DIAGNOSIS — D72819 Decreased white blood cell count, unspecified: Secondary | ICD-10-CM | POA: Diagnosis not present

## 2020-04-03 DIAGNOSIS — D708 Other neutropenia: Secondary | ICD-10-CM | POA: Diagnosis not present

## 2020-04-03 DIAGNOSIS — D1771 Benign lipomatous neoplasm of kidney: Secondary | ICD-10-CM | POA: Diagnosis not present

## 2020-04-03 DIAGNOSIS — Z803 Family history of malignant neoplasm of breast: Secondary | ICD-10-CM | POA: Diagnosis not present

## 2020-04-03 DIAGNOSIS — Z86010 Personal history of colonic polyps: Secondary | ICD-10-CM | POA: Diagnosis not present

## 2020-04-03 DIAGNOSIS — C50919 Malignant neoplasm of unspecified site of unspecified female breast: Secondary | ICD-10-CM | POA: Diagnosis not present

## 2020-04-03 DIAGNOSIS — Z8049 Family history of malignant neoplasm of other genital organs: Secondary | ICD-10-CM | POA: Diagnosis not present

## 2020-04-03 LAB — BASIC METABOLIC PANEL
BUN: 15 (ref 4–21)
CO2: 28 — AB (ref 13–22)
Chloride: 104 (ref 99–108)
Creatinine: 0.8 (ref <=1.1)
Glucose: 99
Potassium: 4.4 (ref 3.4–5.3)
Sodium: 138 (ref 137–147)

## 2020-04-03 LAB — CBC AND DIFFERENTIAL
HCT: 41 (ref 36–46)
Hemoglobin: 13.7 (ref 12.0–16.0)
Neutrophils Absolute: 2
Platelets: 176 (ref 150–399)
WBC: 3.4

## 2020-04-03 LAB — COMPREHENSIVE METABOLIC PANEL
Albumin: 4.1 (ref 3.5–5.0)
Calcium: 9.1 (ref 8.7–10.7)

## 2020-04-03 LAB — HEPATIC FUNCTION PANEL
ALT: 20 (ref 7–35)
AST: 28 (ref 13–35)
Alkaline Phosphatase: 83 (ref 25–125)
Bilirubin, Total: 0.7

## 2020-04-03 LAB — CBC: RBC: 4.3 (ref 3.87–5.11)

## 2020-04-05 ENCOUNTER — Telehealth: Payer: Self-pay | Admitting: *Deleted

## 2020-04-05 DIAGNOSIS — C50911 Malignant neoplasm of unspecified site of right female breast: Secondary | ICD-10-CM | POA: Diagnosis not present

## 2020-04-05 NOTE — Telephone Encounter (Signed)
-----   Message from Arlyss Gandy, [place] sent at 04/05/2020  8:02 AM EDT ----- Recheck 6 months

## 2020-04-05 NOTE — Telephone Encounter (Signed)
Path to patient made follow for 6 months.

## 2020-04-06 ENCOUNTER — Other Ambulatory Visit: Payer: Self-pay | Admitting: Oncology

## 2020-04-06 DIAGNOSIS — C50511 Malignant neoplasm of lower-outer quadrant of right female breast: Secondary | ICD-10-CM

## 2020-04-10 DIAGNOSIS — C499 Malignant neoplasm of connective and soft tissue, unspecified: Secondary | ICD-10-CM | POA: Diagnosis not present

## 2020-04-12 ENCOUNTER — Institutional Professional Consult (permissible substitution): Payer: PPO | Admitting: Plastic Surgery

## 2020-04-17 DIAGNOSIS — C50911 Malignant neoplasm of unspecified site of right female breast: Secondary | ICD-10-CM | POA: Diagnosis not present

## 2020-04-19 DIAGNOSIS — Z803 Family history of malignant neoplasm of breast: Secondary | ICD-10-CM | POA: Diagnosis not present

## 2020-04-19 DIAGNOSIS — D72819 Decreased white blood cell count, unspecified: Secondary | ICD-10-CM | POA: Diagnosis not present

## 2020-04-19 DIAGNOSIS — C50411 Malignant neoplasm of upper-outer quadrant of right female breast: Secondary | ICD-10-CM | POA: Diagnosis not present

## 2020-04-19 DIAGNOSIS — Z8049 Family history of malignant neoplasm of other genital organs: Secondary | ICD-10-CM | POA: Diagnosis not present

## 2020-04-19 DIAGNOSIS — Z85820 Personal history of malignant melanoma of skin: Secondary | ICD-10-CM | POA: Diagnosis not present

## 2020-04-19 DIAGNOSIS — Z800 Family history of malignant neoplasm of digestive organs: Secondary | ICD-10-CM | POA: Diagnosis not present

## 2020-04-23 ENCOUNTER — Encounter: Payer: Self-pay | Admitting: Family Medicine

## 2020-04-24 ENCOUNTER — Ambulatory Visit: Payer: PPO | Admitting: Plastic Surgery

## 2020-04-24 ENCOUNTER — Other Ambulatory Visit: Payer: Self-pay

## 2020-04-24 ENCOUNTER — Encounter: Payer: Self-pay | Admitting: Plastic Surgery

## 2020-04-24 VITALS — BP 137/83 | HR 70 | Temp 98.1°F

## 2020-04-24 DIAGNOSIS — E669 Obesity, unspecified: Secondary | ICD-10-CM | POA: Diagnosis not present

## 2020-04-24 DIAGNOSIS — C50919 Malignant neoplasm of unspecified site of unspecified female breast: Secondary | ICD-10-CM | POA: Diagnosis not present

## 2020-04-24 NOTE — Progress Notes (Signed)
Patient ID: Kathy Joseph, female    DOB: Mar 21, 1956, 64 y.o.   MRN: [id number]   Chief Complaint  Patient presents with  . Advice Only  . Breast Cancer    The patient is a 64 year old female here for consultation for breast reconstruction.  She was diagnosed with right breast cancer by an abnormal mammogram screening done in November 2020.  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  The CT scan done in September showed no evidence of metastatic disease.  The family has a strong history of breast cancer in her mother, sister and 3 maternal aunts.  She was on tamoxifen for 3 years but stopped because of cardiac issues.  She had genetic testing in July 2021 which was negative.  She has not had any radiation.  She has 2 children.  She has a history of melanoma in situ.  She has had a D&C, ovarian cystectomy, tonsillectomy and knee surgery.  The patient is 5 feet 6 inches tall and weighs 232 pounds.  She has grade 3 ptosis of both breasts.  The current plan for treatment is no more surgery and every 69-month evaluation with possible MRI, CT scan and physical exam   Review of Systems  Constitutional: Negative.   HENT: Negative.   Eyes: Negative.   Respiratory: Negative.  Negative for chest tightness and shortness of breath.   Cardiovascular: Negative.   Gastrointestinal: Negative.   Endocrine: Negative.   Genitourinary: Negative.   Musculoskeletal: Negative.   Neurological: Negative.   Hematological: Negative.   Psychiatric/Behavioral: Negative.     Past Medical History:  Diagnosis Date  . Arthritis   . Atypical nevus 06/11/2018   below left ear, moderate atypia  . Atypical nevus 06/11/2018   right mid forehead, moderate to severe (wider shave done 07/08/2018)  . Melanoma ([place]) 01/20/2019   melanoma  in situ on right distal calf TX on 07/29/202  . Varicose veins of lower extremity     Past Surgical History:  Procedure Laterality Date  . ECTOPIC PREGNANCY SURGERY  1986  . KNEE SURGERY Right   . TONSILLECTOMY AND ADENOIDECTOMY        Current Outpatient Medications:  .  Cholecalciferol (VITAMIN D3) 10 MCG (400 UNIT) tablet, Take 400 Units by mouth daily., Disp: , Rfl:  .  Cholecalciferol 10 MCG (400 UNIT) CAPS, , Disp: , Rfl:  .  Eflornithine HCl ([name]) 13.9 % cream, Apply 1 application topically daily., Disp: 45 g, Rfl: 2 .  Magnesium 250 MG TABS, Take by mouth., Disp: , Rfl:  .  Misc Natural Products (GLUCOSAMINE CHOND MSM FORMULA PO), Take by mouth., Disp: , Rfl:  .  multivitamin-lutein (OCUVITE-LUTEIN) CAPS capsule, Take 1 capsule by mouth daily., Disp: , Rfl:  .  Probiotic Product (PROBIOTIC PEARLS ADVANTAGE PO), Take by mouth., Disp: , Rfl:  .  Semaglutide-Weight Management (WEGOVY) 0.25 MG/0.5ML SOAJ, Inject 0.5 mLs (0.25 mg total) into the skin once a week., Disp: 2 mL, Rfl: 0   Objective:   There were no vitals filed for this visit.  Physical Exam Vitals and nursing note reviewed.  Constitutional:      Appearance: Normal appearance.  HENT:     Head: Normocephalic and atraumatic.  Cardiovascular:     Rate and Rhythm: Normal rate.  Pulses: Normal pulses.  Pulmonary:     Effort: Pulmonary effort is normal.  Abdominal:     General: Abdomen is flat.  Neurological:     General: No focal deficit present.     Mental Status: She is alert and oriented to person, place, and time.  Psychiatric:        Mood and Affect: Mood normal.        Behavior: Behavior normal.     Assessment & Plan:  Angiosarcoma of breast (HCC)  Obesity, unspecified classification, unspecified obesity type, unspecified whether serious comorbidity present   We discussed the different options for reconstruction if needed.  Much would depend on any new developments in both the patient's  condition or in the literature.  For the moment it has been agreed for treatment to be close monitoring and observation.  The patient is content with that.  We discussed the possibility of a contralateral breast reduction/mastopexy for better symmetry.  I will send her the brochures for breast reconstruction and breast reduction.  I remain available for any additional questions.  We did discuss implant-based reconstruction if a mastectomy was required.   Pictures were obtained of the patient and placed in the chart with the patient's or guardian's permission.   [place], DO

## 2020-04-25 ENCOUNTER — Encounter: Payer: Self-pay | Admitting: Sports Medicine

## 2020-04-25 ENCOUNTER — Ambulatory Visit (INDEPENDENT_AMBULATORY_CARE_PROVIDER_SITE_OTHER): Payer: PPO | Admitting: Sports Medicine

## 2020-04-25 DIAGNOSIS — L608 Other nail disorders: Secondary | ICD-10-CM

## 2020-04-25 DIAGNOSIS — H25813 Combined forms of age-related cataract, bilateral: Secondary | ICD-10-CM | POA: Diagnosis not present

## 2020-04-25 DIAGNOSIS — H43393 Other vitreous opacities, bilateral: Secondary | ICD-10-CM | POA: Diagnosis not present

## 2020-04-25 DIAGNOSIS — H35363 Drusen (degenerative) of macula, bilateral: Secondary | ICD-10-CM | POA: Diagnosis not present

## 2020-04-25 DIAGNOSIS — H04129 Dry eye syndrome of unspecified lacrimal gland: Secondary | ICD-10-CM | POA: Diagnosis not present

## 2020-04-25 NOTE — Progress Notes (Signed)
Subjective: Kathy Joseph is a 64 y.o. female patient seen today in office with complaint of dark color to nail on right first toe that she noticed after she removed her toenail polish.  Patient reports a history of previous cancer and reports that she wants to have her toe checked to make sure that there was no other problems or issues or any concern for any nail cancer.  Patient denies any pain redness warmth swelling or drainage from the right great toe or toenail.  Patient denies any known trauma or injury.  No other pedal complaints noted.  Patient Active Problem List   Diagnosis Date Noted  . 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 Obesity 03/21/2020  . Dyslipidemia 03/15/2019  . Precancerous skin lesion 03/10/2018  . Hirsutism 03/10/2018  . Varicose veins of lower extremity 03/10/2018  . Arthritis 03/10/2018    Current Outpatient Medications on File Prior to Visit  Medication Sig Dispense Refill  . Cholecalciferol (VITAMIN D3) 10 MCG (400 UNIT) tablet Take 400 Units by mouth daily.    . Cholecalciferol 10 MCG (400 UNIT) CAPS     . Eflornithine HCl ([name]) 13.9 % cream Apply 1 application topically daily. 45 g 2  . Magnesium 250 MG TABS Take by mouth.    . Misc Natural Products (GLUCOSAMINE CHOND MSM FORMULA PO) Take by mouth.    . multivitamin-lutein (OCUVITE-LUTEIN) CAPS capsule Take 1 capsule by mouth daily.    . Probiotic Product (PROBIOTIC PEARLS ADVANTAGE PO) Take by mouth.    . Semaglutide-Weight Management (WEGOVY) 0.25 MG/0.5ML SOAJ Inject 0.5 mLs (0.25 mg total) into the skin once a week. 2 mL 0   No current facility-administered medications on file prior to visit.    Allergies  Allergen Reactions  . Cephalosporins Hives    Objective: Physical Exam  General: Well developed, nourished, no acute distress, awake, alert and oriented x 3  Vascular: Dorsalis pedis artery 2/4 bilateral, Posterior tibial artery 2/4 bilateral, skin temperature warm to warm proximal  to distal bilateral lower extremities, no varicosities, pedal hair present bilateral.  Neurological: Gross sensation present via light touch bilateral.   Dermatological: Skin is warm, dry, and supple bilateral, are within normal limits with distal lifting noted bilateral hallux with mild subungual debris there is a small spot of blood noted to the right hallux nail bed centrally that measures less than 0.5 cm as a streak that goes laterally but is consistent with dry blood likely secondary from microtrauma, no signs of acute ingrowing, no open lesions present bilateral, no callus/corns/hyperkeratotic tissue present bilateral. No signs of infection bilateral.  Musculoskeletal: Asymptomatic hammertoe boney deformities noted bilateral. Muscular strength within normal limits without painon range of motion. No pain with calf compression bilateral.  Assessment and Plan:  Problem List Items Addressed This Visit    None    Visit Diagnoses    Nail hemorrhage    -  Primary      -Examined patient.  -Discussed treatment options for spot of dried blood at right hallux nail -Advised patient at this time we will closely monitor and if changes or if pain return to office sooner -Advised patient to take weekly pictures to help monitor progress as well -Mechanically debrided at no charge and reduced mildly elongated and thickened nails with sterile nail nipper and dremel nail file without incident. -Patient to return in 6 weeks for nail check or sooner if symptoms worsen.  Landis Martins, DPM

## 2020-04-28 ENCOUNTER — Ambulatory Visit
Admission: RE | Admit: 2020-04-28 | Discharge: 2020-04-28 | Disposition: A | Discharge status: Home / Self Care | Payer: PPO | Source: Ambulatory Visit | Attending: Oncology | Admitting: Oncology

## 2020-04-28 DIAGNOSIS — C50511 Malignant neoplasm of lower-outer quadrant of right female breast: Secondary | ICD-10-CM

## 2020-04-28 DIAGNOSIS — D1771 Benign lipomatous neoplasm of kidney: Secondary | ICD-10-CM | POA: Diagnosis not present

## 2020-04-28 MED ORDER — GADOBENATE DIMEGLUMINE 529 MG/ML IV SOLN
19.0000 mL | Freq: Once | INTRAVENOUS | Status: AC | PRN
  Administered 2020-04-28: 19 mL via INTRAVENOUS

## 2020-06-19 ENCOUNTER — Encounter: Payer: Self-pay | Admitting: Sports Medicine

## 2020-06-19 ENCOUNTER — Other Ambulatory Visit: Payer: Self-pay

## 2020-06-19 ENCOUNTER — Ambulatory Visit: Payer: PPO | Admitting: Sports Medicine

## 2020-06-19 DIAGNOSIS — L608 Other nail disorders: Secondary | ICD-10-CM | POA: Diagnosis not present

## 2020-06-19 DIAGNOSIS — L84 Corns and callosities: Secondary | ICD-10-CM | POA: Diagnosis not present

## 2020-06-19 DIAGNOSIS — M79676 Pain in unspecified toe(s): Secondary | ICD-10-CM

## 2020-06-19 NOTE — Patient Instructions (Signed)
Vinegar soaks 1 cup of white distilled vinegar to 8 cups of warm water.  Soak 20 mins. May repeat soak two times per week.  If there is thickness to nails may file nails after soaks or after bath/shower with nail file and apply tea tree oil. Apply oil daily to nails after filing for the best result.  

## 2020-06-19 NOTE — Progress Notes (Signed)
Subjective: Kathy Joseph is a 64 y.o. female patient seen today in office for follow-up evaluation of discoloration to the right great toenail.  Patient reports that her toenail is doing better and slowly little by little the dried blood is growing out.  Patient also admits that she has some callus skin on the side of her right foot at the tailor's bunion.  No other pedal complaints noted.  Patient Active Problem List   Diagnosis Date Noted  . 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 Obesity 03/21/2020  . Lipoma 03/01/2020  . Melanoma in situ ([place]) 03/23/2019  . Dyslipidemia 03/15/2019  . Melanoma in situ of lower leg, right ([place]) 09/09/2018  . Precancerous skin lesion 03/10/2018  . Hirsutism 03/10/2018  . Varicose veins of lower extremity 03/10/2018  . Arthritis 03/10/2018    Current Outpatient Medications on File Prior to Visit  Medication Sig Dispense Refill  . Folic Acid-Vit Z1-IWP Y09 (HOMOCYSTEINE FORMULA) 0.8-50-0.1 MG TABS     . Cholecalciferol (VITAMIN D3) 10 MCG (400 UNIT) tablet Take 400 Units by mouth daily.    . Cholecalciferol 10 MCG (400 UNIT) CAPS     . Eflornithine HCl ([name]) 13.9 % cream Apply 1 application topically daily. 45 g 2  . Magnesium 250 MG TABS Take by mouth.    . Misc Natural Products (GLUCOSAMINE CHOND MSM FORMULA PO) Take by mouth.    . multivitamin-lutein (OCUVITE-LUTEIN) CAPS capsule Take 1 capsule by mouth daily.    Marland Kitchen oxyCODONE (OXY IR/ROXICODONE) 5 MG immediate release tablet SMARTSIG:1 Tablet(s) By Mouth 4-5 Times Daily    . Probiotic Product (PROBIOTIC PEARLS ADVANTAGE PO) Take by mouth.    . Semaglutide-Weight Management (WEGOVY) 0.25 MG/0.5ML SOAJ Inject 0.5 mLs (0.25 mg total) into the skin once a week. 2 mL 0   No current facility-administered medications on file prior to visit.    Allergies  Allergen Reactions  . Cephalosporins Hives    Objective: Physical Exam  General: Well developed, nourished, no acute distress, awake,  alert and oriented x 3  Vascular: Dorsalis pedis artery 2/4 bilateral, Posterior tibial artery 2/4 bilateral, skin temperature warm to warm proximal to distal bilateral lower extremities, no varicosities, pedal hair present bilateral.  Neurological: Gross sensation present via light touch bilateral.   Dermatological: Skin is warm, dry, and supple bilateral, are within normal limits dry blood at right hallux nail that appears to be moving.  Mild reactive dry skin/callus at the fifth MPJ right greater than left with no signs of infection.  Musculoskeletal: Asymptomatic bunion and hammertoe boney deformities noted bilateral. Muscular strength within normal limits without painon range of motion. No pain with calf compression bilateral.  Assessment and Plan:  Problem List Items Addressed This Visit    None    Visit Diagnoses    Nail hemorrhage    -  Primary   Pain around toenail       Callus          -Examined patient.  -Re-Discussed treatment options for spot of dried blood at right hallux nail -Advised patient to continue with monitoring since improving -May continue with tea tree oil and soaking with vinegar as needed -Advised patient to continue with daily skin emollients for callus skin and may benefit from orthotic adjustment to further offload her tailor's bunion -Return as needed or sooner problems or issues arise.  Landis Martins, DPM

## 2020-07-05 ENCOUNTER — Other Ambulatory Visit: Payer: PPO

## 2020-07-10 DIAGNOSIS — C50911 Malignant neoplasm of unspecified site of right female breast: Secondary | ICD-10-CM | POA: Diagnosis not present

## 2020-07-18 ENCOUNTER — Other Ambulatory Visit: Payer: Self-pay | Admitting: Hematology and Oncology

## 2020-07-18 DIAGNOSIS — C50511 Malignant neoplasm of lower-outer quadrant of right female breast: Secondary | ICD-10-CM

## 2020-08-01 ENCOUNTER — Ambulatory Visit: Payer: PPO | Admitting: Orthotics

## 2020-08-01 ENCOUNTER — Other Ambulatory Visit: Payer: Self-pay

## 2020-08-01 DIAGNOSIS — M2141 Flat foot [pes planus] (acquired), right foot: Secondary | ICD-10-CM

## 2020-08-01 DIAGNOSIS — M79676 Pain in unspecified toe(s): Secondary | ICD-10-CM

## 2020-08-01 DIAGNOSIS — L84 Corns and callosities: Secondary | ICD-10-CM

## 2020-08-01 NOTE — Progress Notes (Signed)
Refurbishing f/o and adding 5th met offload (callus) as well as recovering in 76m p-cell

## 2020-08-01 NOTE — Progress Notes (Signed)
[facility]  [address] [place],  [place]  [address] [phone]  Clinic Day:  08/02/2020  Referring physician: Vivi Barrack, MD   This document serves as a record of services personally performed by Hosie Poisson, MD. It was created on their behalf by [facility] E, a trained medical scribe. The creation of this record is based on the scribe's personal observations and the provider's statements to them.   CHIEF COMPLAINT:  CC: Mammary angiosarcoma  Current Treatment:  Surveillance   HISTORY OF PRESENT ILLNESS:  Kathy Joseph is a 65 y.o. female with mammary angiosarcoma of the right breast, diagnosed in August 2021.  This was found on a screening mammogram in November 2020, where she was found to have possible asymmetry in the right breast.  Diagnostic right mammogram and ultrasound revealed the asymmetry to persist on additional views, but ultrasound did not reveal any suspicious masses.  There were areas of focal fibroglandular tissue that may correspond to the abnormality.  Six-month follow-up right diagnostic mammogram and ultrasound were recommended.  In May 2021, there was an increase in the focal asymmetry in the right outer breast.  Ultrasound revealed an irregular mass at 8 o'clock 6 cm from the nipple measuring 1.9 x 1.7 x 1.1 cm.  No enlarged axillary lymph nodes were seen.  Biopsy revealed an angiolipoma with a differential diagnosis of fat necrosis versus malignancy.  The radiologist felt the findings were concordant, but as he was still concerned about the appearance on ultrasound, excision was recommended, so she was referred to Dr. Lilia Pro.  She underwent needle localized lumpectomy on August 21st and surgical pathology from this procedure revealed mammary angiosarcoma.  These results were sent to [facility], and confirmed.  Since even low grade lesions of this type have a substantial risk of distant metastasis, tumors of this type are  generally treated by mastectomy.  CT chest from September 1st revealed no evidence of metastatic disease in the chest.  Bilateral calcified pulmonary nodules are consistent with prior granulomatous disease.  There is a 9 mm subpleural nodule in the left upper lobe with curvilinear calcifications.  Regardless this appears benign.  Multiple fat density lesions involving both kidneys are consistent with multiple angiomyolipomas.  There is also an indeterminate 15 mm lesion arising from the posterior right kidney, which may represent a lipid poor angiomyolipoma.  Further characterization with renal protocol MRI was recommended.  Hypodense lesion in the left lobe of the thyroid gland measuring 12 mm.  Given size, this is not clinically significant.  She has been seen at [facility] in consultation and will be returning for their recommendations.  She started menarche at age 79.  She has had 4 pregnancies, with 3 live births.  Her 1st live birth was at age 30.  She was placed on tamoxifen as chemoprevention for 2-3 years, but this was discontinued after she developed a superficial venous thrombosis of the left leg.  She took oral contraceptives for 9 years after her last child.  The professors at [facility] reviewed the surgical pathology from her procedure, and they did not recommend any further excision at this time as her margins were clear.  The closest margin was 1.5 mm.  They did recommend close follow up with breast MRI and CT imaging every 6 months for the next 2 years.    INTERVAL HISTORY:  Kathy Joseph is here for routine follow up and states that she has been well and denies complaints.  She is scheduled at [facility] in March for CT and breast MRI imaging.  Blood counts and chemistries are unremarkable.  Her  appetite is good, and her weight is stable since her last visit.  She denies fever, chills or other signs of infection.  She denies nausea, vomiting, bowel issues, or abdominal pain.  She denies sore throat, cough,  dyspnea, or chest pain.  REVIEW OF SYSTEMS:  Review of Systems  Constitutional: Negative.   HENT:  Negative.   Eyes: Negative.   Respiratory: Negative.   Cardiovascular: Negative.   Gastrointestinal: Negative.   Endocrine: Negative.   Genitourinary: Negative.    Musculoskeletal: Negative.   Skin: Negative.   Neurological: Negative.   Hematological: Negative.   Psychiatric/Behavioral: Negative.      VITALS:  Blood pressure 135/73, pulse 64, temperature 97.9 F (36.6 C), temperature source Oral, resp. rate 18, height 5' 6.5" (1.689 m), weight 231 lb 12.8 oz (105.1 kg), SpO2 95 %.  Wt Readings from Last 3 Encounters:  08/02/20 231 lb 12.8 oz (105.1 kg)  03/21/20 231 lb (104.8 kg)  03/14/19 236 lb 3.2 oz (107.1 kg)    Body mass index is 36.85 kg/m.  Performance status (ECOG): 0 - Asymptomatic  PHYSICAL EXAM:  Physical Exam Constitutional:      General: She is not in acute distress.    Appearance: Normal appearance. She is normal weight.  HENT:     Head: Normocephalic and atraumatic.  Eyes:     General: No scleral icterus.    Extraocular Movements: Extraocular movements intact.     Conjunctiva/sclera: Conjunctivae normal.     Pupils: Pupils are equal, round, and reactive to light.  Cardiovascular:     Rate and Rhythm: Normal rate and regular rhythm.     Pulses: Normal pulses.     Heart sounds: Normal heart sounds. No murmur heard. No friction rub. No gallop.   Pulmonary:     Effort: Pulmonary effort is normal. No respiratory distress.     Breath sounds: Normal breath sounds.  Chest:     Comments: Both breasts are without masses.  Well healed scar in the lower outer quadrant of the right breast. Abdominal:     General: Bowel sounds are normal. There is no distension.     Palpations: Abdomen is soft. There is no mass.     Tenderness: There is no abdominal tenderness.  Musculoskeletal:        General: Normal range of motion.     Cervical back: Normal range of  motion and neck supple.     Right lower leg: No edema.     Left lower leg: No edema.  Lymphadenopathy:     Cervical: No cervical adenopathy.  Skin:    General: Skin is warm and dry.  Neurological:     General: No focal deficit present.     Mental Status: She is alert and oriented to person, place, and time. Mental status is at baseline.  Psychiatric:        Mood and Affect: Mood normal.        Behavior: Behavior normal.        Thought Content: Thought content normal.        Judgment: Judgment normal.     LABS:   CBC Latest Ref Rng & Units 04/03/2020 03/14/2019  WBC - 3.4 5.7  Hemoglobin 12.0 - 16.0 13.7 13.7  Hematocrit 36 - 46 41 40.4  Platelets 150 - 399 176 185.0   CMP Latest  Ref Rng & Units 04/03/2020 03/14/2019  Glucose 70 - 99 mg/dL - 96  BUN 4 - _0 Creatinine 0.5 - 1.1 0.8 0.87  Sodium 137 - 147 138 140  Potassium 3.4 - 5.3 4.4 4.1  Chloride 99 - 108 104 104  CO2 13 - 22 28(A) 29  Calcium 8.7 - 10.7 9.1 9.3  Total Protein 6.0 - 8.3 g/dL - 6.5  Total Bilirubin 0.2 - 1.2 mg/dL - 0.4  Alkaline Phos 25 - 125 83 82  AST 13 - 35 28 18  ALT 7 - 35 20 17    STUDIES:  No results found.   Allergies:  Allergies  Allergen Reactions  . Cephalosporins Hives    Current Medications: Current Outpatient Medications  Medication Sig Dispense Refill  . Cholecalciferol (VITAMIN D3) 10 MCG (400 UNIT) tablet Take 400 Units by mouth daily.    . Eflornithine HCl ([name]) 13.9 % cream Apply 1 application topically daily. 45 g 2  . Folic Acid-Vit L9-JTT S17 (HOMOCYSTEINE FORMULA) 0.8-50-0.1 MG TABS     . Magnesium 250 MG TABS Take by mouth.    . Misc Natural Products (GLUCOSAMINE CHOND MSM FORMULA PO) Take by mouth.    . multivitamin-lutein (OCUVITE-LUTEIN) CAPS capsule Take 1 capsule by mouth daily.    Marland Kitchen oxyCODONE (OXY IR/ROXICODONE) 5 MG immediate release tablet SMARTSIG:1 Tablet(s) By Mouth 4-5 Times Daily    . Probiotic Product (PROBIOTIC PEARLS ADVANTAGE PO) Take by  mouth.    . Semaglutide-Weight Management (WEGOVY) 0.25 MG/0.5ML SOAJ Inject 0.5 mLs (0.25 mg total) into the skin once a week. (Patient taking differently: Inject into the skin once a week. Have not started this medication yet) 2 mL 0   No current facility-administered medications for this visit.     ASSESSMENT & PLAN:   Assessment:   1.  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  She has undergone lumpectomy with clear margins.  [facility] has not recommended further surgical excision.  She will be undergoing MRI breast and CT imaging every 6 months for close follow up.  2.  Strong family history of breast cancer.  The patient met with Vida Roller to discuss genetic testing and [facility] Multi Cancer Panel test was pursued.  The results of the patient's genetic testing was negative-no clinically significant mutation identified.  There was a variant of uncertain significance (VUS) found in the [facility] gene.  These findings and their significance were reviewed with the patient.  3.  History of melanoma in situ of the lower right calf, diagnosed by Dr. Marge Duncans, dermatology in [place].  This was treated with wide excision.  She follows up with dermatology routinely and recently underwent two biopsies.     4.  Multiple angiomyolipomas of bilateral kidneys on CT scan.  There is also an indeterminate 15 mm lesion arising from the posterior right kidney.  This may represent a lipid poor angiomyolipoma, but further characterization with renal protocol MRI was recommended.  5.  Hypodense lesion in the left lobe of the thyroid gland measuring 12 mm.    6.  Colon polyps and familial history of colon cancer.  She should have a repeat colonoscopy in the next 1-2 years.    Plan: She is scheduled at [facility] in March for routine CT and breast MRI imaging for continued close follow up.  I will see her in 6 months for repeat examination so that we can alternate with her physicians at [facility].  She understands and agrees with this plan of care.  I have answered her questions and she knows to call with any concerns.   I provided 20 minutes of face-to-face time during this this encounter and > 50% was spent counseling as documented under my assessment and plan.    Derwood Kaplan, MD [facility] [address] [place] [address] Dept: [phone] Dept Fax: [phone]   I, Rita Ohara, am acting as scribe for Derwood Kaplan, MD  I have reviewed this report as typed by the medical scribe, and it is complete and accurate.

## 2020-08-02 ENCOUNTER — Encounter: Payer: Self-pay | Admitting: Oncology

## 2020-08-02 ENCOUNTER — Inpatient Hospital Stay: Payer: PPO | Admitting: Oncology

## 2020-08-02 ENCOUNTER — Other Ambulatory Visit: Payer: Self-pay | Admitting: Hematology and Oncology

## 2020-08-02 ENCOUNTER — Inpatient Hospital Stay: Payer: PPO | Attending: Oncology

## 2020-08-02 ENCOUNTER — Other Ambulatory Visit: Payer: Self-pay

## 2020-08-02 ENCOUNTER — Telehealth: Payer: Self-pay | Admitting: Oncology

## 2020-08-02 VITALS — BP 135/73 | HR 64 | Temp 97.9°F | Resp 18 | Ht 66.5 in | Wt 231.8 lb

## 2020-08-02 DIAGNOSIS — C50919 Malignant neoplasm of unspecified site of unspecified female breast: Secondary | ICD-10-CM

## 2020-08-02 DIAGNOSIS — D649 Anemia, unspecified: Secondary | ICD-10-CM | POA: Diagnosis not present

## 2020-08-02 LAB — COMPREHENSIVE METABOLIC PANEL
Albumin: 4.2 (ref 3.5–5.0)
Calcium: 9.3 (ref 8.7–10.7)

## 2020-08-02 LAB — BASIC METABOLIC PANEL
BUN: 16 (ref 4–21)
CO2: 30 — AB (ref 13–22)
Chloride: 104 (ref 99–108)
Creatinine: 1 (ref 0.5–1.1)
Glucose: 102
Potassium: 4.3 (ref 3.4–5.3)
Sodium: 138 (ref 137–147)

## 2020-08-02 LAB — HEPATIC FUNCTION PANEL
ALT: 22 (ref 7–35)
AST: 30 (ref 13–35)
Alkaline Phosphatase: 88 (ref 25–125)
Bilirubin, Total: 0.4

## 2020-08-02 NOTE — Telephone Encounter (Signed)
Per 1/13 LOS, patient scheduled for June Appt. Patient entered Appt in her phone

## 2020-08-30 ENCOUNTER — Telehealth: Payer: Self-pay | Admitting: Sports Medicine

## 2020-08-30 NOTE — Telephone Encounter (Signed)
Pt called checking on status of orthotics that were to be redone. You seen pt on 1.12.2022 in [place] office. Note states refurbishing.

## 2020-09-08 ENCOUNTER — Encounter: Payer: Self-pay | Admitting: Family Medicine

## 2020-09-11 NOTE — Telephone Encounter (Signed)
See note

## 2020-09-19 NOTE — Telephone Encounter (Signed)
Pt is following up on this. Insurance has told her they need supporting documents to why she needs this medication.

## 2020-09-20 NOTE — Telephone Encounter (Addendum)
Call insurance at [phone] to start PA  PA done, will fax determination    PA for Rx Wegovy: denied

## 2020-10-08 DIAGNOSIS — Z85831 Personal history of malignant neoplasm of soft tissue: Secondary | ICD-10-CM | POA: Diagnosis not present

## 2020-10-08 DIAGNOSIS — C499 Malignant neoplasm of connective and soft tissue, unspecified: Secondary | ICD-10-CM | POA: Diagnosis not present

## 2020-10-08 LAB — HM MAMMOGRAPHY

## 2020-10-29 ENCOUNTER — Ambulatory Visit: Payer: PPO | Admitting: Physician Assistant

## 2020-10-29 ENCOUNTER — Ambulatory Visit: Payer: PPO | Admitting: Dermatology

## 2020-11-01 DIAGNOSIS — C50511 Malignant neoplasm of lower-outer quadrant of right female breast: Secondary | ICD-10-CM | POA: Diagnosis not present

## 2020-11-01 DIAGNOSIS — C499 Malignant neoplasm of connective and soft tissue, unspecified: Secondary | ICD-10-CM | POA: Diagnosis not present

## 2020-11-28 ENCOUNTER — Ambulatory Visit (INDEPENDENT_AMBULATORY_CARE_PROVIDER_SITE_OTHER): Payer: PPO | Admitting: Family Medicine

## 2020-11-28 ENCOUNTER — Encounter: Payer: Self-pay | Admitting: Family Medicine

## 2020-11-28 ENCOUNTER — Other Ambulatory Visit: Payer: Self-pay

## 2020-11-28 VITALS — BP 126/80 | HR 66 | Temp 98.3°F | Ht 67.0 in | Wt 233.4 lb

## 2020-11-28 DIAGNOSIS — M2550 Pain in unspecified joint: Secondary | ICD-10-CM | POA: Diagnosis not present

## 2020-11-28 DIAGNOSIS — W57XXXA Bitten or stung by nonvenomous insect and other nonvenomous arthropods, initial encounter: Secondary | ICD-10-CM

## 2020-11-28 DIAGNOSIS — S70262A Insect bite (nonvenomous), left hip, initial encounter: Secondary | ICD-10-CM

## 2020-11-28 MED ORDER — DOXYCYCLINE HYCLATE 100 MG PO TABS: 100.0000 mg | ORAL_TABLET | Freq: Two times a day (BID) | ORAL | 0 refills | Status: AC

## 2020-11-28 NOTE — Progress Notes (Signed)
Subjective  CC:  Chief Complaint  Patient presents with  . Insect Bite    Tick bug, left hip area. April 24th was out in woods, got tick removed April 26th  . Pain    Waist down pain, throbbing legs aching hard to walk. Feels heavy   Same day acute visit; PCP not available. New pt to me. Chart reviewed.   HPI: Kathy Joseph is a 65 y.o. female who presents to the office today to address the problems listed above in the chief complaint.  65 year old female presents due to recent tick bite and secondary pain.  She reports she noticed a tick bite on her left hip on April 24.  It was removed about 48 hours later.  She denies secondary rash, fevers but since has had persistent intermittent mild to moderate pain in bilateral legs.  The pain is atypical and that it comes and goes, can be in her joints and or muscles.  Pain is described as throbbing and is bad enough that it has interfered with her sleep.  It has been improved with Tylenol and Advil but recurs.  She did take a 4-hour hike prior to the leg pain but this pain is different than typical muscle soreness.  She has not used any new medications.  She is not on a statin.  She is in no associated fevers or systemic symptoms.  She has arthritis in her low back and knees but this pain feels different.  She has no radicular symptoms.  No bowel or bladder problems.  No malaise or fatigue.  Appetite is unaffected.  She has had no rash.  She worries about possibility of Lyme disease.   Assessment  1. Tick bite of left hip, initial encounter   2. Polyarthralgia      Plan   Tick bite with pain in lower extremities: Atypical but given tick that was present for 48 hours, will cover with doxycycline 100 mg twice a day for 7 days.  Continue Tylenol and Advil for pain.  Monitor for rash or systemic symptoms.  Follow-up if not improved.  Patient stands agrees with care plan  Follow up: As needed 04/01/2021  No orders of the defined types were  placed in this encounter.  Meds ordered this encounter  Medications  . doxycycline (VIBRA-TABS) 100 MG tablet    Sig: Take 1 tablet (100 mg total) by mouth 2 (two) times daily for 7 days.    Dispense:  14 tablet    Refill:  0      I reviewed the patients updated PMH, FH, and SocHx.    Patient Active Problem List   Diagnosis Date Noted  . Angiosarcoma of breast ([place]) 03/21/2020  . Obesity 03/21/2020  . Lipoma 03/01/2020  . Melanoma in situ ([place]) 03/23/2019  . Dyslipidemia 03/15/2019  . Melanoma in situ of lower leg, right ([place]) 09/09/2018  . Precancerous skin lesion 03/10/2018  . Hirsutism 03/10/2018  . Varicose veins of lower extremity 03/10/2018  . Arthritis 03/10/2018   Current Meds  Medication Sig  . Cholecalciferol (VITAMIN D3) 10 MCG (400 UNIT) tablet Take 400 Units by mouth daily.  Marland Kitchen doxycycline (VIBRA-TABS) 100 MG tablet Take 1 tablet (100 mg total) by mouth 2 (two) times daily for 7 days.  . Eflornithine HCl ([name]) 13.9 % cream Apply 1 application topically daily.  . Folic Acid-Vit B7-SEG B15 (HOMOCYSTEINE FORMULA) 0.8-50-0.1 MG TABS   . Magnesium 250 MG TABS Take by mouth.  . Misc  Natural Products (GLUCOSAMINE CHOND MSM FORMULA PO) Take by mouth.  . multivitamin-lutein (OCUVITE-LUTEIN) CAPS capsule Take 1 capsule by mouth daily.  . Probiotic Product (PROBIOTIC PEARLS ADVANTAGE PO) Take by mouth.    Allergies: Patient is allergic to cephalosporins. Family History: Patient family history includes Breast cancer in her maternal aunt, mother, paternal aunt, and sister; Colon cancer in her father. Social History:  Patient  reports that she has never smoked. She has never used smokeless tobacco. She reports current alcohol use of about 3.0 standard drinks of alcohol per week. She reports that she does not use drugs.  Review of Systems: Constitutional: Negative for fever malaise or anorexia Cardiovascular: negative for chest pain Respiratory: negative for SOB or  persistent cough Gastrointestinal: negative for abdominal pain  Objective  Vitals: BP 126/80   Pulse 66   Temp 98.3 F (36.8 C) (Temporal)   Ht 5\' 7"  (1.702 m)   Wt 233 lb 6.4 oz (105.9 kg)   SpO2 98%   BMI 36.56 kg/m  General: no acute distress , A&Ox3 HEENT: PEERL, conjunctiva normal, neck is supple Cardiovascular:  RRR without murmur or gallop.  Respiratory:  Good breath sounds bilaterally, CTAB with normal respiratory effort Skin:  Warm, no rashes, tick bite on left hip with small marking without erythema warmth fluctuance or rash Lower extremities: No hot swollen joints, mild tenderness over muscles.  Normal-appearing lower extremities.  Normal pulses, no calf tenderness or cords palpated.  Normal gait     Commons side effects, risks, benefits, and alternatives for medications and treatment plan prescribed today were discussed, and the patient expressed understanding of the given instructions. Patient is instructed to call or message via MyChart if he/she has any questions or concerns regarding our treatment plan. No barriers to understanding were identified. We discussed Red Flag symptoms and signs in detail. Patient expressed understanding regarding what to do in case of urgent or emergency type symptoms.   Medication list was reconciled, printed and provided to the patient in AVS. Patient instructions and summary information was reviewed with the patient as documented in the AVS. This note was prepared with assistance of Dragon voice recognition software. Occasional wrong-word or sound-a-like substitutions may have occurred due to the inherent limitations of voice recognition software  This visit occurred during the SARS-CoV-2 public health emergency.  Safety protocols were in place, including screening questions prior to the visit, additional usage of staff PPE, and extensive cleaning of exam room while observing appropriate contact time as indicated for disinfecting solutions.

## 2020-11-28 NOTE — Patient Instructions (Signed)
Please follow up if symptoms do not improve or as needed.    Tick Bite Information, Adult Ticks are insects that draw blood for food. Most ticks live in shrubs and grassy and wooded areas. They climb onto people and animals that brush against the leaves and grasses that they rest on. Then they bite, attaching themselves to the skin. Most ticks are harmless, but some ticks may carry germs that can spread to a person through a bite and cause a disease. To reduce your risk of getting a disease from a tick bite, make sure you:  Take steps to prevent tick bites.  Check for ticks after being outdoors where ticks live.  Watch for symptoms of disease if a tick attached to you or if you suspect a tick bite. How can I prevent tick bites? Take these steps to help prevent tick bites when you go outdoors in an area where ticks live: Use insect repellent  Use insect repellent that has DEET (20% or higher), picaridin, or IR3535 in it. Follow the instructions on the label. Use these products on: ? Bare skin. ? The top of your boots. ? Your pant legs. ? Your sleeve cuffs.  For insect repellent that contains permethrin, follow the instructions on the label. Use these products on: ? Clothing. ? Boots. ? Outdoor gear. ? Tents. When you are outside  Wear protective clothing. Long sleeves and long pants offer the best protection from ticks.  Wear light-colored clothing so you can see ticks more easily.  Tuck your pant legs into your socks.  If you go walking on a trail, stay in the middle of the trail so your skin, hair, and clothing do not touch the bushes.  Avoid walking through areas with long grass.  Check for ticks on your clothing, hair, and skin often while you are outside, and check again before you go inside. Make sure to check the scalp, neck, armpits, waist, groin, and joint areas. These are the spots where ticks attach themselves most often. When you go indoors  Check your clothing for  ticks. Tumble dry clothes in a dryer on high heat for at least 10 minutes. If clothes are damp, additional time may be needed. If clothes require washing, use hot water.  Examine gear and pets.  Shower soon after being outdoors.  Check your body for ticks. Conduct a full body check using a mirror. What is the proper way to remove a tick? If you find a tick on your body, remove it as soon as possible. Removing a tick sooner can prevent germs from passing to your body. Do not remove the tick with your bare fingers. To remove a tick that is crawling on your skin but has not bitten, use either of these methods:  Go outdoors and brush the tick off.  Remove the tick with tape or a lint roller. To remove a tick that is attached to your skin: 1. Wash your hands. If you have latex gloves, put them on. 2. Use fine-tipped tweezers, curved forceps, or a tick-removal tool to gently grasp the tick as close to your skin and the tick's head as possible. 3. Gently pull with a steady, upward, even pressure until the tick lets go. 4. When removing the tick: ? Take care to keep the tick's head attached to its body. ? Do not twist or jerk the tick. This can make the tick's head or mouth parts break off and remain in the skin. ? Do not  squeeze or crush the tick's body. This could force disease-carrying fluids from the tick into your body. Do not try to remove a tick with heat, alcohol, petroleum jelly, or fingernail polish. Using these methods can cause the tick to salivate and regurgitate into your bloodstream, increasing your risk of getting a disease.   What should I do after removing a tick?  Dispose of the tick. Do not crush a tick with your fingers.  Clean the bite area and your hands with soap and water, rubbing alcohol, or an iodine scrub.  If an antiseptic cream or ointment is available, apply a small amount to the bite site.  Wash and disinfect any instruments that you used to remove the tick. How  should I dispose of a tick? To dispose of a live tick, use one of these methods:  Place it in rubbing alcohol.  Place it in a sealed bag or container.  Wrap it tightly in tape.  Flush it down the toilet. Contact a health care provider if:  You have symptoms of a disease after a tick bite. Symptoms of a tick-borne disease can occur from moments after the tick bites to 30 days after a tick is removed. Symptoms include: ? Fever or chills. ? Any of these signs in the bite area:  A red rash that makes a circle (bull's-eye rash) in the bite area.  Redness and swelling. ? Headache. ? Muscle, joint, or bone pain. ? Abnormal tiredness. ? Numbness in your legs or difficulty walking or moving your legs. ? Tender, swollen lymph glands.  A part of a tick breaks off and gets stuck in your skin. Get help right away if:  You are not able to remove a tick.  You experience muscle weakness or paralysis.  Your symptoms get worse or you experience new symptoms.  You find an engorged tick on your skin and you are in an area where disease from ticks is a high risk. Summary  Ticks may carry germs that can spread to a person through a bite and cause a disease.  Wear protective clothing and use insect repellent to prevent tick bites. Follow the instructions on the label.  If you find a tick on your body, remove it as soon as possible. If the tick is attached, do not try to remove with heat, alcohol, petroleum jelly, or fingernail polish.  Remove the attached tick using fine-tipped tweezers, curved forceps, or a tick-removal tool. Gently pull with steady, upward, even pressure until the tick lets go. Do not twist or jerk the tick. Do not squeeze or crush the tick's body.  If you have symptoms of a disease after being bitten by a tick, contact a health care provider. This information is not intended to replace advice given to you by your health care provider. Make sure you discuss any questions you  have with your health care provider. Document Revised: 07/04/2019 Document Reviewed: 07/04/2019 Elsevier Patient Education  2021 [employer].

## 2020-12-12 ENCOUNTER — Ambulatory Visit: Payer: PPO | Admitting: Physician Assistant

## 2020-12-31 DIAGNOSIS — H25813 Combined forms of age-related cataract, bilateral: Secondary | ICD-10-CM | POA: Diagnosis not present

## 2020-12-31 DIAGNOSIS — H524 Presbyopia: Secondary | ICD-10-CM | POA: Diagnosis not present

## 2021-01-17 ENCOUNTER — Ambulatory Visit: Payer: PPO | Admitting: Hematology and Oncology

## 2021-01-25 DIAGNOSIS — C50911 Malignant neoplasm of unspecified site of right female breast: Secondary | ICD-10-CM | POA: Diagnosis not present

## 2021-01-29 ENCOUNTER — Inpatient Hospital Stay: Payer: PPO

## 2021-01-29 ENCOUNTER — Other Ambulatory Visit: Payer: Self-pay | Admitting: Hematology and Oncology

## 2021-01-29 ENCOUNTER — Other Ambulatory Visit: Payer: Self-pay

## 2021-01-29 ENCOUNTER — Inpatient Hospital Stay: Payer: PPO | Attending: Hematology and Oncology | Admitting: Hematology and Oncology

## 2021-01-29 ENCOUNTER — Encounter: Payer: Self-pay | Admitting: Hematology and Oncology

## 2021-01-29 ENCOUNTER — Telehealth: Payer: Self-pay | Admitting: Hematology and Oncology

## 2021-01-29 VITALS — BP 136/65 | HR 80 | Temp 98.0°F | Resp 20 | Ht 67.0 in | Wt 232.5 lb

## 2021-01-29 DIAGNOSIS — C50511 Malignant neoplasm of lower-outer quadrant of right female breast: Secondary | ICD-10-CM | POA: Diagnosis not present

## 2021-01-29 DIAGNOSIS — C50919 Malignant neoplasm of unspecified site of unspecified female breast: Secondary | ICD-10-CM | POA: Diagnosis not present

## 2021-01-29 LAB — CBC AND DIFFERENTIAL
HCT: 40 (ref 36–46)
Hemoglobin: 14.3 (ref 12.0–16.0)
Neutrophils Absolute: 1.8
Platelets: 184 (ref 150–399)
WBC: 3.6

## 2021-01-29 LAB — BASIC METABOLIC PANEL
BUN: 16 (ref 4–21)
CO2: 22 (ref 13–22)
Chloride: 104 (ref 99–108)
Creatinine: 0.7 (ref 0.5–1.1)
Glucose: 108
Potassium: 4.5 (ref 3.4–5.3)
Sodium: 136 — AB (ref 137–147)

## 2021-01-29 LAB — COMPREHENSIVE METABOLIC PANEL
Albumin: 4.3 (ref 3.5–5.0)
Calcium: 9.1 (ref 8.7–10.7)

## 2021-01-29 LAB — HEPATIC FUNCTION PANEL
ALT: 25 (ref 7–35)
AST: 31 (ref 13–35)
Alkaline Phosphatase: 91 (ref 25–125)
Bilirubin, Total: 0.6

## 2021-01-29 LAB — CBC: RBC: 4.33 (ref 3.87–5.11)

## 2021-01-29 NOTE — Progress Notes (Signed)
[facility]  [address] [place],  [place]  [address] [phone]  Clinic Day:  01/29/2021  Referring physician: Vivi Barrack, MD   CHIEF COMPLAINT:  CC: A 65 year old female with history of mammary angiosarcoma here for 6 month evaluation.  Current Treatment:  Surveillance   HISTORY OF PRESENT ILLNESS:  Kathy Joseph is a 65 y.o. female with mammary angiosarcoma of the right breast, diagnosed in August 2021.  This was found on a screening mammogram in November 2020, where she was found to have possible asymmetry in the right breast.  Diagnostic right mammogram and ultrasound revealed the asymmetry to persist on additional views, but ultrasound did not reveal any suspicious masses.  There were areas of focal fibroglandular tissue that may correspond to the abnormality.  Six-month follow-up right diagnostic mammogram and ultrasound were recommended.  In May 2021, there was an increase in the focal asymmetry in the right outer breast.  Ultrasound revealed an irregular mass at 8 o'clock 6 cm from the nipple measuring 1.9 x 1.7 x 1.1 cm.  No enlarged axillary lymph nodes were seen.  Biopsy revealed an angiolipoma with a differential diagnosis of fat necrosis versus malignancy.  The radiologist felt the findings were concordant, but as he was still concerned about the appearance on ultrasound, excision was recommended, so she was referred to Dr. Lilia Pro.  She underwent needle localized lumpectomy on August 21st and surgical pathology from this procedure revealed mammary angiosarcoma.  These results were sent to [facility], and confirmed.  Since even low grade lesions of this type have a substantial risk of distant metastasis, tumors of this type are generally treated by mastectomy.  CT chest from September 1st revealed no evidence of metastatic disease in the chest.  Bilateral calcified pulmonary nodules are consistent with prior granulomatous disease.  There is a 9  mm subpleural nodule in the left upper lobe with curvilinear calcifications.  Regardless this appears benign.  Multiple fat density lesions involving both kidneys are consistent with multiple angiomyolipomas.  There is also an indeterminate 15 mm lesion arising from the posterior right kidney, which may represent a lipid poor angiomyolipoma.  Further characterization with renal protocol MRI was recommended.  Hypodense lesion in the left lobe of the thyroid gland measuring 12 mm.  Given size, this is not clinically significant.  She has been seen at [facility] in consultation and will be returning for their recommendations.  She started menarche at age 41.  She has had 4 pregnancies, with 3 live births.  Her 1st live birth was at age 26.  She was placed on tamoxifen as chemoprevention for 2-3 years, but this was discontinued after she developed a superficial venous thrombosis of the left leg.  She took oral contraceptives for 9 years after her last child.  The professors at [facility] reviewed the surgical pathology from her procedure, and they did not recommend any further excision at this time as her margins were clear.  The closest margin was 1.5 mm.  They did recommend close follow up with breast MRI and CT imaging every 6 months for the next 2 years.    INTERVAL HISTORY:  Kathy Joseph is here for routine follow up and states that she has been well and denies complaints. Recent CT and MRI imaging from [facility] are benign. She is scheduled for routine mammogram and follow up with Dr. Lilia Pro in September. She continues to follow with dermatology and is scheduled to see them in  August. She has been advised to schedule routine exams with Gyn as her type of melanoma can spread to the vagina. She is looking for a doctor and will start these exams soon. She had a history of thyroid lesion; this has since resolved and is no longer followed. She will be due colonoscopy in the next 1-2 years. She denies fever, chills, nausea or  vomiting. She denies shortness of breath, cough or chest pain. She denies issue with bowel or bladder. CBC and CMP are unremarkable today.   Review of Systems  Constitutional:  Negative for appetite change, chills, diaphoresis, fatigue, fever and unexpected weight change.  HENT:   Negative for hearing loss, lump/mass, mouth sores, nosebleeds, sore throat, tinnitus, trouble swallowing and voice change.   Eyes:  Negative for eye problems and icterus.  Respiratory:  Negative for chest tightness, cough, hemoptysis, shortness of breath and wheezing.   Cardiovascular:  Negative for chest pain, leg swelling and palpitations.  Gastrointestinal:  Negative for abdominal distention, abdominal pain, blood in stool, constipation, diarrhea, nausea, rectal pain and vomiting.  Endocrine: Negative for hot flashes.  Genitourinary:  Negative for bladder incontinence, difficulty urinating, dyspareunia, dysuria, frequency, hematuria and nocturia.   Musculoskeletal:  Negative for arthralgias, back pain, flank pain, gait problem, myalgias, neck pain and neck stiffness.  Skin:  Negative for itching, rash and wound.  Neurological:  Negative for dizziness, extremity weakness, gait problem, headaches, light-headedness, numbness, seizures and speech difficulty.  Hematological:  Negative for adenopathy. Does not bruise/bleed easily.  Psychiatric/Behavioral:  Negative for confusion, decreased concentration, depression, sleep disturbance and suicidal ideas. The patient is not nervous/anxious.     VITALS:  Blood pressure 136/65, pulse 80, temperature 98 F (36.7 C), temperature source Oral, resp. rate 20, height 5' 7"  (1.702 m), weight 232 lb 8 oz (105.5 kg), SpO2 95 %.  Wt Readings from Last 3 Encounters:  01/29/21 232 lb 8 oz (105.5 kg)  11/28/20 233 lb 6.4 oz (105.9 kg)  08/02/20 231 lb 12.8 oz (105.1 kg)    Body mass index is 36.41 kg/m.  Performance status (ECOG): 0 - Asymptomatic  PHYSICAL EXAM:  Physical  Exam Constitutional:      General: She is not in acute distress.    Appearance: Normal appearance. She is normal weight. She is not ill-appearing, toxic-appearing or diaphoretic.  HENT:     Head: Normocephalic and atraumatic.     Right Ear: Tympanic membrane normal.     Left Ear: Tympanic membrane normal.     Nose: Nose normal. No congestion or rhinorrhea.     Mouth/Throat:     Mouth: Mucous membranes are moist.     Pharynx: Oropharynx is clear. No oropharyngeal exudate or posterior oropharyngeal erythema.  Eyes:     General: No scleral icterus.       Right eye: No discharge.        Left eye: No discharge.     Extraocular Movements: Extraocular movements intact.     Conjunctiva/sclera: Conjunctivae normal.     Pupils: Pupils are equal, round, and reactive to light.  Neck:     Vascular: No carotid bruit.  Cardiovascular:     Rate and Rhythm: Normal rate and regular rhythm.     Heart sounds: No murmur heard.   No friction rub. No gallop.  Pulmonary:     Effort: Pulmonary effort is normal. No respiratory distress.     Breath sounds: Normal breath sounds. No stridor. No wheezing, rhonchi or rales.  Chest:  Chest wall: No mass, lacerations, deformity, swelling, tenderness, crepitus or edema. There is no dullness to percussion.  Breasts:    Breasts are symmetrical.     Right: Normal. No swelling, bleeding, inverted nipple, mass, nipple discharge, skin change, tenderness, axillary adenopathy or supraclavicular adenopathy.     Left: Normal. No swelling, bleeding, inverted nipple, mass, nipple discharge, skin change, tenderness, axillary adenopathy or supraclavicular adenopathy.  Abdominal:     General: Abdomen is flat. Bowel sounds are normal. There is no distension.     Palpations: There is no mass.     Tenderness: There is no abdominal tenderness. There is no right CVA tenderness, left CVA tenderness, guarding or rebound.     Hernia: No hernia is present.  Musculoskeletal:         General: No swelling, tenderness, deformity or signs of injury. Normal range of motion.     Cervical back: Normal range of motion and neck supple. No rigidity or tenderness.     Right lower leg: No edema.     Left lower leg: No edema.  Lymphadenopathy:     Cervical: No cervical adenopathy.     Upper Body:     Right upper body: No supraclavicular, axillary or pectoral adenopathy.     Left upper body: No supraclavicular, axillary or pectoral adenopathy.  Skin:    General: Skin is warm and dry.     Capillary Refill: Capillary refill takes less than 2 seconds.     Coloration: Skin is not jaundiced or pale.     Findings: No bruising, erythema, lesion or rash.  Neurological:     General: No focal deficit present.     Mental Status: She is alert and oriented to person, place, and time. Mental status is at baseline.     Cranial Nerves: No cranial nerve deficit.     Sensory: No sensory deficit.     Motor: No weakness.     Coordination: Coordination normal.     Gait: Gait normal.     Deep Tendon Reflexes: Reflexes normal.  Psychiatric:        Mood and Affect: Mood normal.        Behavior: Behavior normal.        Thought Content: Thought content normal.        Judgment: Judgment normal.    LABS:   CBC Latest Ref Rng & Units 01/29/2021 04/03/2020 03/14/2019  WBC - 3.6 3.4 5.7  Hemoglobin 12.0 - 16.0 14.3 13.7 13.7  Hematocrit 36 - 46 40 41 40.4  Platelets 150 - 399 184 176 185.0   CMP Latest Ref Rng & Units 01/29/2021 08/02/2020 04/03/2020  Glucose 70 - 99 mg/dL - - -  BUN 4 - 21 16 16 15   Creatinine 0.5 - 1.1 0.7 1.0 0.8  Sodium 137 - 147 136(A) 138 138  Potassium 3.4 - 5.3 4.5 4.3 4.4  Chloride 99 - 108 104 104 104  CO2 13 - 22 22 30(A) 28(A)  Calcium 8.7 - 10.7 9.1 9.3 9.1  Total Protein 6.0 - 8.3 g/dL - - -  Total Bilirubin 0.2 - 1.2 mg/dL - - -  Alkaline Phos 25 - 125 91 88 83  AST 13 - 35 31 30 28   ALT 7 - 35 25 22 20     STUDIES:   EXAM: MRI BREAST BILATERAL W WO  CONTRAST DATE: 10/08/2020 9:51 AM ACCESSION: [id number] DICTATED: 10/08/2020 4:41 PM INTERPRETATION LOCATION: [facility]   CLINICAL INDICATION: 65 years old  Female: History of angiosarcoma - C49.9 - Angiosarcoma (CMS - HCC)    TECHNIQUE: Multisequence, multiplanar MR images including precontrast T2 and T1 without fat saturation, dynamic pre- and post- contrast axial T1- weighted images with fat suppression and a high-resolution post-contrast sequence reformatted in the sagittal plane were performed. Subtracted and MIP images were reviewed. Enhancement kinetics were also evaluated with DynaCad. The patient received 20 mL of MultiHance intravenously. If IV contrast had not been administered, the likelihood of detecting abnormalities relevant to the patient's indication would have been substantially decreased.    COMPARISON: 2022 chest CT same day, 2021 and 2020 mammogram studies from [facility]    FINDINGS:   AMOUNT OF FIBROGLANDULAR TISSUE: Almost entirely fat.   Background Parenchymal Enhancement is minimal and symmetric.   Right breast: Expected postsurgical changes are present in the lateral aspect, middle depth. There are no suspicious enhancing masses or areas of non-mass enhancement. No evidence of axillary or internal mammary lymphadenopathy is seen. There is no abnormal skin, nipple, or pectoralis muscle enhancement.   Left breast: There are no suspicious enhancing masses or areas of non-mass enhancement. No evidence of axillary or internal mammary lymphadenopathy is seen. There is no abnormal skin, nipple, or pectoralis muscle enhancement.   Visualized extramammary soft tissues are normal.   ASSESSMENT: BI-RADS Category: [id number]: Benign.   Recommendation Laterality: Both      No MR evidence of malignancy in either breast.   Routine annual MRI and mammography are recommended for this high risk patient.  EXAM: CT CHEST W  CONTRAST DATE: 10/08/2020 11:27 AM ACCESSION: [id number] DICTATED: 10/08/2020 11:57 AM INTERPRETATION LOCATION: [facility]  CLINICAL INDICATION: 65years old Female with Angiosarcoma - C49.9 - Angiosarcoma (CMS - HCC)    COMPARISON: CT chest 03/21/2020   TECHNIQUE: A helical CT scan was obtained with IV contrast from the thoracic inlet through the hemidiaphragms. Images were reconstructed in the axial plane. Coronal and sagittal reformatted images of the chest were also provided for further evaluation of the lung parenchyma.   FINDINGS:    AIRWAYS, LUNGS, PLEURA: Clear central tracheobronchial tree. No lung consolidation. Calcified granuloma. No pleural effusion.   MEDIASTINUM: Normal heart size. No pericardial effusion. Normal caliber thoracic aorta. No mediastinal lymphadenopathy.   IMAGED ABDOMEN: Partially imaged hypoattenuating exophytic lesion within the right kidney (3:109)..   SOFT TISSUES: Unchanged left thyroid nodule measuring 1.3 cm (3:11).   BONES: Unremarkable.   IMPRESSION: No evidence of metastatic disease within the chest.   Allergies:  Allergies  Allergen Reactions   Cephalosporins Hives    Current Medications: Current Outpatient Medications  Medication Sig Dispense Refill   Cholecalciferol (VITAMIN D3) 10 MCG (400 UNIT) tablet Take 400 Units by mouth daily.     Eflornithine HCl ([name]) 13.9 % cream Apply 1 application topically daily. 45 g 2   Folic Acid-Vit [id number](HOMOCYSTEINE FORMULA) 0.8-50-0.1 MG TABS      Magnesium 250 MG TABS Take by mouth.     Misc Natural Products (GLUCOSAMINE CHOND MSM FORMULA PO) Take by mouth.     multivitamin-lutein (OCUVITE-LUTEIN) CAPS capsule Take 1 capsule by mouth daily.     Probiotic Product (PROBIOTIC PEARLS ADVANTAGE PO) Take by mouth.     No current facility-administered medications for this visit.     ASSESSMENT & PLAN:   Assessment:   1.  Low grade mammary angiosarcoma of the right breast, diagnosed  in August 2021.  She  has undergone lumpectomy with clear margins.  [facility] has not recommended further surgical excision.  She will be undergoing MRI breast and CT imaging every 6 months for close follow up. Most recent imaging in March was benign.  2.  Strong family history of breast cancer.  The patient met with Vida Roller to discuss genetic testing and [facility] Multi Cancer Panel test was pursued.  The results of the patient's genetic testing was negative-no clinically significant mutation identified.  There was a variant of uncertain significance (VUS) found in the [facility] gene.  These findings and their significance were reviewed with the patient.  3.  History of melanoma in situ of the lower right calf, diagnosed by Dr. Marge Duncans, dermatology in [place].  This was treated with wide excision.  She follows up with dermatology routinely and recently underwent two biopsies.     4.  Multiple angiomyolipomas of bilateral kidneys on CT scan.  There is also an indeterminate 15 mm lesion arising from the posterior right kidney.  This may represent a lipid poor angiomyolipoma, but further characterization with renal protocol MRI was recommended.  5.  Hypodense lesion in the left lobe of the thyroid gland measuring 12 mm. This has since resolved and is no longer followed.  6.  Colon polyps and familial history of colon cancer.  She should have a repeat colonoscopy in the next 1-2 years.    Plan: She is scheduled for mammogram and follow up with Dr. Lilia Pro in September. She continues to follow closely every 3 months between here, [facility] and Dr. Pietro Cassis office. She will continue with other surveillance including dermatology, GI, and Gyn. We will see her back in 6 months with repeat CBC, CMP and evaluation.   She verbalizes understanding of and agreement to the plans discussed today. She knows to call the office should any new questions or concerns arise.     Melodye Ped, NP [facility] [address] [place] [address] Dept: [phone] Dept Fax: [phone]

## 2021-01-29 NOTE — Telephone Encounter (Signed)
Per 7/12 los next appt scheduled and confirmed by patient

## 2021-02-19 ENCOUNTER — Other Ambulatory Visit: Payer: Self-pay

## 2021-02-19 DIAGNOSIS — I8390 Asymptomatic varicose veins of unspecified lower extremity: Secondary | ICD-10-CM

## 2021-02-28 ENCOUNTER — Encounter: Payer: Self-pay | Admitting: Physician Assistant

## 2021-02-28 ENCOUNTER — Ambulatory Visit: Payer: PPO | Admitting: Physician Assistant

## 2021-02-28 ENCOUNTER — Other Ambulatory Visit: Payer: Self-pay

## 2021-02-28 DIAGNOSIS — Z85820 Personal history of malignant melanoma of skin: Secondary | ICD-10-CM

## 2021-02-28 DIAGNOSIS — Z1283 Encounter for screening for malignant neoplasm of skin: Secondary | ICD-10-CM | POA: Diagnosis not present

## 2021-02-28 DIAGNOSIS — D485 Neoplasm of uncertain behavior of skin: Secondary | ICD-10-CM

## 2021-02-28 DIAGNOSIS — L738 Other specified follicular disorders: Secondary | ICD-10-CM

## 2021-02-28 DIAGNOSIS — L814 Other melanin hyperpigmentation: Secondary | ICD-10-CM | POA: Diagnosis not present

## 2021-02-28 DIAGNOSIS — Z86018 Personal history of other benign neoplasm: Secondary | ICD-10-CM

## 2021-02-28 NOTE — Patient Instructions (Signed)

## 2021-03-05 ENCOUNTER — Encounter: Payer: Self-pay | Admitting: Physician Assistant

## 2021-03-05 ENCOUNTER — Ambulatory Visit (INDEPENDENT_AMBULATORY_CARE_PROVIDER_SITE_OTHER): Payer: PPO | Admitting: Physician Assistant

## 2021-03-05 ENCOUNTER — Other Ambulatory Visit: Payer: Self-pay

## 2021-03-05 ENCOUNTER — Ambulatory Visit (HOSPITAL_COMMUNITY)
Admission: RE | Admit: 2021-03-05 | Discharge: 2021-03-05 | Disposition: A | Discharge status: Home / Self Care | Payer: PPO | Source: Ambulatory Visit | Attending: Vascular Surgery | Admitting: Vascular Surgery

## 2021-03-05 VITALS — BP 116/65 | HR 64 | Temp 97.9°F | Ht 67.0 in | Wt 235.3 lb

## 2021-03-05 DIAGNOSIS — I8390 Asymptomatic varicose veins of unspecified lower extremity: Secondary | ICD-10-CM | POA: Diagnosis not present

## 2021-03-05 NOTE — Progress Notes (Signed)
[facility] [place]   Reason for referral: Swollen left leg medial calf  History of Present Illness  Kathy Joseph is a 65 y.o. female who presents with chief complaint: swollen leg.  Patient notes, onset of swelling several months ago, associated with prolonged sitting and standing.  This has not caused her pain and  heaviness.  The patient has had no history of DVT, no history of varicose vein, no history of venous stasis ulcers, no history of  Lymphedema and no history of skin changes in lower legs.  There is no family history of venous disorders.  The patient has not used compression stockings in the past.  Past Medical History:  Diagnosis Date   Arthritis    Atypical nevus 06/11/2018   below left ear, moderate atypia   Atypical nevus 06/11/2018   right mid forehead, moderate to severe (wider shave done 07/08/2018)   Melanoma ([place]) 01/20/2019   melanoma in situ on right distal calf [place] on 07/29/202   Varicose veins of lower extremity     Past Surgical History:  Procedure Laterality Date   ECTOPIC PREGNANCY SURGERY  1986   KNEE SURGERY Right    TONSILLECTOMY AND ADENOIDECTOMY      Social History   Socioeconomic History   Marital status: Married    Spouse name: Not on file   Number of children: Not on file   Years of education: Not on file   Highest education level: Not on file  Occupational History   Occupation: Retired  Tobacco Use   Smoking status: Never   Smokeless tobacco: Never  Vaping Use   Vaping Use: Never used  Substance and Sexual Activity   Alcohol use: Yes    Alcohol/week: 3.0 standard drinks    Types: 3 Glasses of wine per week   Drug use: Never   Sexual activity: Yes    Partners: Male  Other Topics Concern   Not on file  Social History Narrative   Not on file   Social Determinants of Health   Financial Resource Strain: Low Risk    Difficulty of Paying Living Expenses: Not hard at all  Food Insecurity: No Food  Insecurity   Worried About Charity fundraiser in the Last Year: Never true   [place] in the Last Year: Never true  Transportation Needs: No Transportation Needs   Lack of Transportation (Medical): No   Lack of Transportation (Non-Medical): No  Physical Activity: Inactive   Days of Exercise per Week: 0 days   Minutes of Exercise per Session: 0 min  Stress: Stress Concern Present   Feeling of Stress : To some extent  Social Connections: Engineer, building services of Communication with Friends and Family: More than three times a week   Frequency of Social Gatherings with Friends and Family: Once a week   Attends Religious Services: More than 4 times per year   Active Member of [employer] or Organizations: Yes   Attends Archivist Meetings: Never   Marital Status: Married  Human resources officer Violence: Not At Risk   Fear of Current or Ex-Partner: No   Emotionally Abused: No   Physically Abused: No   Sexually Abused: No    Family History  Problem Relation Age of Onset   Breast cancer Mother    Colon cancer Father        Diagnosed in 3s.    Breast cancer Sister    Breast cancer Paternal Aunt  Breast cancer Maternal Aunt     Current Outpatient Medications on File Prior to Visit  Medication Sig Dispense Refill   Cholecalciferol (VITAMIN D3) 10 MCG (400 UNIT) tablet Take 400 Units by mouth daily.     Eflornithine HCl ([name]) 13.9 % cream Apply 1 application topically daily. 45 g 2   Folic Acid-Vit Q000111Q 123456 (HOMOCYSTEINE FORMULA) 0.8-50-0.1 MG TABS      LORazepam (ATIVAN) 1 MG tablet Take 1 mg by mouth daily as needed.     Magnesium 250 MG TABS Take by mouth.     Misc Natural Products (GLUCOSAMINE CHOND MSM FORMULA PO) Take by mouth.     multivitamin-lutein (OCUVITE-LUTEIN) CAPS capsule Take 1 capsule by mouth daily.     PFIZER-BIONT COVID-19 VAC-TRIS SUSP injection      Probiotic Product (PROBIOTIC PEARLS ADVANTAGE PO) Take by mouth.     No current  facility-administered medications on file prior to visit.    Allergies as of 03/05/2021 - Review Complete 03/05/2021  Allergen Reaction Noted   Cephalosporins Hives 10/25/2019     ROS:   General:  No weight loss, Fever, chills  HEENT: No recent headaches, no nasal bleeding, no visual changes, no sore throat  Neurologic: No dizziness, blackouts, seizures. No recent symptoms of stroke or mini- stroke. No recent episodes of slurred speech, or temporary blindness.  Cardiac: No recent episodes of chest pain/pressure, no shortness of breath at rest.  No shortness of breath with exertion.  Denies history of atrial fibrillation or irregular heartbeat  Vascular: No history of rest pain in feet.  No history of claudication.  No history of non-healing ulcer, No history of DVT   Pulmonary: No home oxygen, no productive cough, no hemoptysis,  No asthma or wheezing  Musculoskeletal:  '[ ]'$  Arthritis, '[ ]'$  Low back pain,  '[ ]'$  Joint pain  Hematologic:No history of hypercoagulable state.  No history of easy bleeding.  No history of anemia  Gastrointestinal: No hematochezia or melena,  No gastroesophageal reflux, no trouble swallowing  Urinary: '[ ]'$  chronic Kidney disease, '[ ]'$  on HD - '[ ]'$  MWF or '[ ]'$  TTHS, '[ ]'$  Burning with urination, '[ ]'$  Frequent urination, '[ ]'$  Difficulty urinating;   Skin: No rashes  Psychological: No history of anxiety,  No history of depression  Physical Examination  Vitals:   03/05/21 1342  BP: 116/65  Pulse: 64  Temp: 97.9 F (36.6 C)  TempSrc: Temporal  SpO2: 97%  Weight: 235 lb 4.8 oz (106.7 kg)  Height: '5\' 7"'$  (1.702 m)    Body mass index is 36.85 kg/m.  General:  Alert and oriented, no acute distress HEENT: Normal Neck: No bruit or JVD Pulmonary: Clear to auscultation bilaterally Cardiac: Regular Rate and Rhythm without murmur Abdomen: Soft, non-tender, non-distended, no mass, no scars Skin: No rash     Extremity Pulses:  2+ radial, brachial, femoral,  dorsalis pedis, posterior tibial pulses bilaterally Musculoskeletal: No deformity or edema  Neurologic: Upper and lower extremity motor 5/5 and symmetric  DATA:    Venous Reflux Times  +--------------+---------+------+-----------+------------+--------+  RIGHT         Reflux NoRefluxReflux TimeDiameter cmsComments                          Yes                                   +--------------+---------+------+-----------+------------+--------+  CFV           no                                              +--------------+---------+------+-----------+------------+--------+  FV mid        no                                              +--------------+---------+------+-----------+------------+--------+  Popliteal     no                                              +--------------+---------+------+-----------+------------+--------+  GSV at SFJ              yes    >500 ms      0.42              +--------------+---------+------+-----------+------------+--------+  GSV prox thighno                            0.32              +--------------+---------+------+-----------+------------+--------+  GSV mid thigh           yes    >500 ms      0.36              +--------------+---------+------+-----------+------------+--------+  GSV dist thigh          yes    >500 ms      0.36              +--------------+---------+------+-----------+------------+--------+  GSV at knee             yes    >500 ms      0.33              +--------------+---------+------+-----------+------------+--------+  GSV prox calf           yes    >500 ms      0.30              +--------------+---------+------+-----------+------------+--------+  SSV Pop Fossa no                            0.18              +--------------+---------+------+-----------+------------+--------+  SSV prox calf no                            0.16               +--------------+---------+------+-----------+------------+--------+  SSV mid calf  no                            0.18              +--------------+---------+------+-----------+------------+--------+      +--------------+---------+------+-----------+------------+--------+  LEFT          Reflux NoRefluxReflux TimeDiameter cmsComments  Yes                                   +--------------+---------+------+-----------+------------+--------+  CFV                     yes   >1 second                       +--------------+---------+------+-----------+------------+--------+  FV mid        no                                              +--------------+---------+------+-----------+------------+--------+  Popliteal               yes   >1 second                       +--------------+---------+------+-----------+------------+--------+  GSV at SFJ              yes    >500 ms      0.67              +--------------+---------+------+-----------+------------+--------+  GSV prox thigh          yes    >500 ms      0.53              +--------------+---------+------+-----------+------------+--------+  GSV mid thigh           yes    >500 ms      0.47              +--------------+---------+------+-----------+------------+--------+  GSV dist thigh          yes    >500 ms      0.34              +--------------+---------+------+-----------+------------+--------+  GSV at knee             yes    >500 ms      0.40              +--------------+---------+------+-----------+------------+--------+  GSV prox calf           yes    >500 ms      0.40              +--------------+---------+------+-----------+------------+--------+  SSV Pop Fossa no                            0.16              +--------------+---------+------+-----------+------------+--------+  SSV prox calf no                             0.18              +--------------+---------+------+-----------+------------+--------+  SSV mid calf  no                            0.16              +--------------+---------+------+-----------+------------+--------+     Summary:  Bilateral:  - No evidence of deep vein thrombosis seen in the lower extremities,  bilaterally, from the common femoral through  the popliteal veins.  - No evidence of superficial venous thrombosis in the lower extremities,  bilaterally.     Right:  - Venous reflux is noted in the right sapheno-femoral junction.  - Venous reflux is noted in the right greater saphenous vein in the thigh.  - Venous reflux is noted in the right greater saphenous vein in the calf.     Left:  - Venous reflux is noted in the left common femoral vein.  - Venous reflux is noted in the left sapheno-femoral junction.  - Venous reflux is noted in the left greater saphenous vein in the thigh.  - Venous reflux is noted in the left greater saphenous vein in the calf.  - Venous reflux is noted in the left popliteal vein  Assessment: Venous reflux B LE asymptomatic The right LE  reflux is noted at the [facility], but the vein size below the junction is not enlarged. The left LE has SFJ reflux and the GSV with vein size > 0.4.  cm.   She denise symptoms of reflux with no history of bleeding episodes, weeping or non healing ulcers.  She has palpable pulses and is not at risk of limb loss.  Plan: She will wear compression 20-30, elevate at rest and walk for exercise.  She wants to try the compression and see how she does.  If she develops chronic worsening edema, skin changes, non healing venous wounds or varicose veins she will call us for a f/u.  She does qualify for laser ablation on the left as far as her SFJ with GSV vein size > 0.4 cm.     Roxy Horseman PA-C [facility] [place] Office: [phone]  MD in clinic [place]

## 2021-03-05 NOTE — Progress Notes (Signed)
   Follow-Up Visit   Subjective  Kathy Joseph is a 65 y.o. female who presents for the following: Annual Exam (Patient here today for skin check, per patient she has a new lesion on her left side of face x 3 months no bleeding, no pain. Per patient she does have a personal history of atypical  moles, and melanoma. No family history of atypical moles, melanoma or non mole skin cancer. ).   The following portions of the chart were reviewed this encounter and updated as appropriate:  Tobacco  Allergies  Meds  Problems  Med Hx  Surg Hx  Fam Hx      Objective  Well appearing patient in no apparent distress; mood and affect are within normal limits.  A full examination was performed including scalp, head, eyes, ears, nose, lips, neck, chest, axillae, abdomen, back, buttocks, bilateral upper extremities, bilateral lower extremities, hands, feet, fingers, toes, fingernails, and toenails. All findings within normal limits unless otherwise noted below.  Left Upper Arm - Anterior Bichromic dark nested macule.        Assessment & Plan  Neoplasm of uncertain behavior of skin Left Upper Arm - Anterior  Skin / nail biopsy Type of biopsy: tangential   Informed consent: discussed and consent obtained   Timeout: patient name, date of birth, surgical site, and procedure verified   Anesthesia: the lesion was anesthetized in a standard fashion   Anesthetic:  1% lidocaine w/ epinephrine 1-100,000 local infiltration Instrument used: flexible razor blade   Hemostasis achieved with: aluminum chloride   Outcome: patient tolerated procedure well   Post-procedure details: sterile dressing applied and wound care instructions given   Dressing type: bandage and petrolatum    Specimen 1 - Surgical pathology Differential Diagnosis: R/O Atypia  Check Margins: Yes  Sebaceous hyperplasia Head - Anterior (Face)  Schedule surgery in [place]   I, Yudit Modesitt, PA-C, have reviewed all  documentation's for this visit.  The documentation on 03/05/21 for the exam, diagnosis, procedures and orders are all accurate and complete.

## 2021-03-19 ENCOUNTER — Telehealth: Payer: Self-pay | Admitting: *Deleted

## 2021-03-19 NOTE — Telephone Encounter (Signed)
Path to patient  

## 2021-04-01 ENCOUNTER — Ambulatory Visit (INDEPENDENT_AMBULATORY_CARE_PROVIDER_SITE_OTHER): Payer: PPO

## 2021-04-01 DIAGNOSIS — E669 Obesity, unspecified: Secondary | ICD-10-CM | POA: Diagnosis not present

## 2021-04-01 DIAGNOSIS — Z0000 Encounter for general adult medical examination without abnormal findings: Secondary | ICD-10-CM

## 2021-04-01 NOTE — Progress Notes (Signed)
Virtual Visit via Telephone Note  I connected with  Alethia Goshorn on 04/01/21 at  9:30 AM EDT by telephone and verified that I am speaking with the correct person using two identifiers.  Medicare Annual Wellness visit completed telephonically due to Covid-19 pandemic.   Persons participating in this call: This Health Coach and this patient.   Location: Patient: Home Provider: Office   I discussed the limitations, risks, security and privacy concerns of performing an evaluation and management service by telephone and the availability of in person appointments. The patient expressed understanding and agreed to proceed.  Unable to perform video visit due to video visit attempted and failed and/or patient does not have video capability.   Some vital signs may be absent or patient reported.   Willette Brace, LPN   Subjective:   Jenisse Seibold is a 65 y.o. female who presents for Medicare Annual (Subsequent) preventive examination.  Review of Systems     Cardiac Risk Factors include: advanced age (>93mn, >>42women);obesity (BMI >30kg/m2);dyslipidemia     Objective:    Today's Vitals   04/01/21 0932  PainSc: 3    There is no height or weight on file to calculate BMI.  Advanced Directives 04/01/2021 03/05/2021 08/02/2020 03/22/2020  Does Patient Have a Medical Advance Directive? No Yes No No  Does patient want to make changes to medical advance directive? - Yes (ED - Information included in AVS) - -  Would patient like information on creating a medical advance directive? Yes (MAU/Ambulatory/Procedural Areas - Information given) - No - Patient declined Yes (MAU/Ambulatory/Procedural Areas - Information given)    Current Medications (verified) Outpatient Encounter Medications as of 04/01/2021  Medication Sig   Cholecalciferol (VITAMIN D3) 10 MCG (400 UNIT) tablet Take 400 Units by mouth daily.   Eflornithine HCl ([name]) 13.9 % cream Apply 1 application topically daily.    Folic Acid-Vit [id number](HOMOCYSTEINE FORMULA) 0.8-50-0.1 MG TABS    Magnesium 250 MG TABS Take by mouth.   Misc Natural Products (GLUCOSAMINE CHOND MSM FORMULA PO) Take by mouth.   multivitamin-lutein (OCUVITE-LUTEIN) CAPS capsule Take 1 capsule by mouth daily.   Probiotic Product (PROBIOTIC PEARLS ADVANTAGE PO) Take by mouth.   LORazepam (ATIVAN) 1 MG tablet Take 1 mg by mouth daily as needed. (Patient not taking: Reported on 04/01/2021)   PFIZER-BIONT COVID-19 VAC-TRIS SUSP injection    No facility-administered encounter medications on file as of 04/01/2021.    Allergies (verified) Cephalosporins   History: Past Medical History:  Diagnosis Date   Arthritis    Atypical nevus 06/11/2018   below left ear, moderate atypia   Atypical nevus 06/11/2018   right mid forehead, moderate to severe (wider shave done 07/08/2018)   [place] 01/20[place]   melanoma in situ on right distal calf [place] on 07/29/202   Varicose veins of lower extremity    Past Surgical History:  Procedure Laterality Date   ECTOPIC PREGNANCY SURGERY  1986   KNEE SURGERY Right    TONSILLECTOMY AND ADENOIDECTOMY     Family History  Problem Relation Age of Onset   Breast cancer Mother    Colon cancer Father        Diagnosed in 66s    Breast cancer Sister    Breast cancer Paternal Aunt    Breast cancer Maternal Aunt    Social History   Socioeconomic History   Marital status: Married    Spouse name: Not on file   Number of children: Not on file  Years of education: Not on file   Highest education level: Not on file  Occupational History   Occupation: Retired  Tobacco Use   Smoking status: Never   Smokeless tobacco: Never  Vaping Use   Vaping Use: Never used  Substance and Sexual Activity   Alcohol use: Yes    Alcohol/week: 3.0 standard drinks    Types: 3 Glasses of wine per week   Drug use: Never   Sexual activity: Yes    Partners: Male  Other Topics Concern   Not on file  Social History  Narrative   Not on file   Social Determinants of Health   Financial Resource Strain: Low Risk    Difficulty of Paying Living Expenses: Not hard at all  Food Insecurity: No Food Insecurity   Worried About Charity fundraiser in the Last Year: Never true   [place] in the Last Year: Never true  Transportation Needs: No Transportation Needs   Lack of Transportation (Medical): No   Lack of Transportation (Non-Medical): No  Physical Activity: Inactive   Days of Exercise per Week: 0 days   Minutes of Exercise per Session: 0 min  Stress: No Stress Concern Present   Feeling of Stress : Not at all  Social Connections: Socially Integrated   Frequency of Communication with Friends and Family: More than three times a week   Frequency of Social Gatherings with Friends and Family: More than three times a week   Attends Religious Services: More than 4 times per year   Active Member of [employer] or Organizations: Yes   Attends Archivist Meetings: 1 to 4 times per year   Marital Status: Married    Tobacco Counseling Counseling given: Not Answered   Clinical Intake:  Pre-visit preparation completed: Yes  Pain : 0-10 Pain Score: 3  Pain Type: Acute pain Pain Location: Heel Pain Orientation: Left Pain Descriptors / Indicators: Pressure Pain Onset: 1 to 4 weeks ago Pain Frequency: Intermittent     BMI - recorded: 36.85 Nutritional Status: BMI > 30  Obese Nutritional Risks: None Diabetes: No  How often do you need to have someone help you when you read instructions, pamphlets, or other written materials from your doctor or pharmacy?: 1 - Never  Diabetic?No  Interpreter Needed?: No  Information entered by :: Charlott Rakes, LPN   Activities of Daily Living In your present state of health, do you have any difficulty performing the following activities: 04/01/2021  Hearing? N  Vision? Y  Comment right eye macular degenerative issues  Difficulty concentrating or  making decisions? N  Walking or climbing stairs? N  Dressing or bathing? N  Doing errands, shopping? N  Preparing Food and eating ? N  Using the Toilet? N  In the past six months, have you accidently leaked urine? N  Do you have problems with loss of bowel control? N  Managing your Medications? N  Managing your Finances? N  Housekeeping or managing your Housekeeping? N  Some recent data might be hidden    Patient Care Team: Vivi Barrack, MD as PCP - General (Family Medicine) Clark-Burning, Anderson Malta, PA-C (Inactive) (Dermatology) Warren Danes, PA-C as Physician Assistant (Dermatology)  Indicate any recent Medical Services you may have received from other than [facility] providers in the past year (date may be approximate).     Assessment:   This is a routine wellness examination for Izora.  Hearing/Vision screen Hearing Screening - Comments:: Pt denies any  hearing issues  Vision Screening - Comments:: Pt follows up annually with Dr Alen Blew   Dietary issues and exercise activities discussed: Current Exercise Habits: The patient does not participate in regular exercise at present   Goals Addressed             This Visit's Progress    Patient Stated       Looking for other options for weight loss       Depression Screen PHQ 2/9 Scores 04/01/2021 03/22/2020 03/14/2019 03/10/2018  PHQ - 2 Score 0 1 0 0    Fall Risk Fall Risk  04/01/2021 03/05/2021 03/22/2020 06/15/2019 03/14/2019  Falls in the past year? 0 0 0 0 0  Comment - - - Emmi Telephone Survey: data to providers prior to load -  Number falls in past yr: 0 - 0 - -  Injury with Fall? 0 - 0 - -  Risk for fall due to : Impaired vision;Impaired mobility - Impaired vision - -  Follow up Falls prevention discussed - Falls prevention discussed - -    FALL RISK PREVENTION PERTAINING TO THE HOME:  Any stairs in or around the home? Yes  If so, are there any without handrails? No  Home free of loose throw  rugs in walkways, pet beds, electrical cords, etc? Yes  Adequate lighting in your home to reduce risk of falls? Yes   ASSISTIVE DEVICES UTILIZED TO PREVENT FALLS:  Life alert? No  Use of a cane, walker or w/c? No  Grab bars in the bathroom? Yes  Shower chair or bench in shower? No  Elevated toilet seat or a handicapped toilet? No   TIMED UP AND GO:  Was the test performed? No .   Cognitive Function:     6CIT Screen 04/01/2021 03/22/2020  What Year? 0 points 0 points  What month? 0 points 0 points  What time? 0 points -  Count back from 20 0 points 0 points  Months in reverse 0 points 0 points  Repeat phrase 0 points 0 points  Total Score 0 -    Immunizations Immunization History  Administered Date(s) Administered   Fluad Quad(high Dose 65+) 03/14/2019   Influenza,inj,Quad PF,6+ Mos 05/04/2018   Moderna Sars-Covid-2 Vaccination 08/24/2019, 09/22/2019, 06/07/2020   Pfizer Covid-19 Vaccine Bivalent Booster 30yr & up 11/30/2020   Pneumococcal Polysaccharide-23 03/14/2019    TDAP status: Due, Education has been provided regarding the importance of this vaccine. Advised may receive this vaccine at local pharmacy or Health Dept. Aware to provide a copy of the vaccination record if obtained from local pharmacy or Health Dept. Verbalized acceptance and understanding.  Flu Vaccine status: Due, Education has been provided regarding the importance of this vaccine. Advised may receive this vaccine at local pharmacy or Health Dept. Aware to provide a copy of the vaccination record if obtained from local pharmacy or Health Dept. Verbalized acceptance and understanding.  Pneumococcal vaccine status: Due, Education has been provided regarding the importance of this vaccine. Advised may receive this vaccine at local pharmacy or Health Dept. Aware to provide a copy of the vaccination record if obtained from local pharmacy or Health Dept. Verbalized acceptance and understanding.  Covid-19  vaccine status: Completed vaccines  Qualifies for Shingles Vaccine? Yes   Zostavax completed No   Shingrix Completed?: No.    Education has been provided regarding the importance of this vaccine. Patient has been advised to call insurance company to determine out of pocket expense if they have  not yet received this vaccine. Advised may also receive vaccine at local pharmacy or Health Dept. Verbalized acceptance and understanding.  Screening Tests Health Maintenance  Topic Date Due   TETANUS/TDAP  Never done   Zoster Vaccines- Shingrix (1 of 2) Never done   PNA vac Low Risk Adult (2 of 2 - PCV13) 03/13/2020   COVID-19 Vaccine (4 - Booster for Moderna series) 08/30/2020   INFLUENZA VACCINE  02/18/2021   MAMMOGRAM  05/16/2021   COLONOSCOPY (Pts 45-34yr Insurance coverage will need to be confirmed)  01/27/2027   DEXA SCAN  Completed   Hepatitis C Screening  Completed   HPV VACCINES  Aged Out    Health Maintenance  Health Maintenance Due  Topic Date Due   TETANUS/TDAP  Never done   Zoster Vaccines- Shingrix (1 of 2) Never done   PNA vac Low Risk Adult (2 of 2 - PCV13) 03/13/2020   COVID-19 Vaccine (4 - Booster for Moderna series) 08/30/2020   INFLUENZA VACCINE  02/18/2021    Colorectal cancer screening: Type of screening: Colonoscopy. Completed 01/26/17. Repeat every 10 years  Mammogram status: Completed 05/17/19. Repeat every year  Bone Density status: Completed 03/30/19. Results reflect: Bone density results: NORMAL. Repeat every 2 years.   Additional Screening:  Hepatitis C Screening:  Completed 03/14/19  Vision Screening: Recommended annual ophthalmology exams for early detection of glaucoma and other disorders of the eye. Is the patient up to date with their annual eye exam?  Yes  Who is the provider or what is the name of the office in which the patient attends annual eye exams? Dr JAlen Blew If pt is not established with a provider, would they like to be referred  to a provider to establish care? No .   Dental Screening: Recommended annual dental exams for proper oral hygiene  Community Resource Referral / Chronic Care Management: CRR required this visit?  No   CCM required this visit?  No      Plan:     I have personally reviewed and noted the following in the patient's chart:   Medical and social history Use of alcohol, tobacco or illicit drugs  Current medications and supplements including opioid prescriptions.  Functional ability and status Nutritional status Physical activity Advanced directives List of other physicians Hospitalizations, surgeries, and ER visits in previous 12 months Vitals Screenings to include cognitive, depression, and falls Referrals and appointments  In addition, I have reviewed and discussed with patient certain preventive protocols, quality metrics, and best practice recommendations. A written personalized care plan for preventive services as well as general preventive health recommendations were provided to patient.     TWillette Brace LPN   [id number]  Nurse Notes: Pt stated that weight loss medication given was too expensive and denied by insurance, Pt is interested in a referral to [facility] at [place] if covered by insurance as another option for weight loss, please advise?

## 2021-04-01 NOTE — Addendum Note (Signed)
Addended by: Vivi Barrack on: 04/01/2021 11:51 AM   Modules accepted: Orders

## 2021-04-01 NOTE — Patient Instructions (Signed)
Kathy Joseph , Thank you for taking time to come for your Medicare Wellness Visit. I appreciate your ongoing commitment to your health goals. Please review the following plan we discussed and let me know if I can assist you in the future.   Screening recommendations/referrals: Colonoscopy: Done 01/26/17 repeat every 10 years due 01/27/27 Mammogram: Done 12/12/19 repeat every year  Bone Density: Done 03/30/19 repeat every 2 years  Recommended yearly ophthalmology/optometry visit for glaucoma screening and checkup Recommended yearly dental visit for hygiene and checkup  Vaccinations: Influenza vaccine: due Pneumococcal vaccine: Due  Tdap vaccine: Due and discussed Shingles vaccine: Shingrix discus/sed. Please contact your pharmacy for coverage information.    Covid-19:Completed 2/3, 3/4, 06/07/20 & 11/30/20  Advanced directives: Advance directive discussed with you today. I have provided a copy for you to complete at home and have notarized. Once this is complete please bring a copy in to our office so we can scan it into your chart.   Conditions/risks identified: Get different options on weight loss   Next appointment: Follow up in one year for your annual wellness visit    Preventive Care 65 Years and Older, Female Preventive care refers to lifestyle choices and visits with your health care provider that can promote health and wellness. What does preventive care include? A yearly physical exam. This is also called an annual well check. Dental exams once or twice a year. Routine eye exams. Ask your health care provider how often you should have your eyes checked. Personal lifestyle choices, including: Daily care of your teeth and gums. Regular physical activity. Eating a healthy diet. Avoiding tobacco and drug use. Limiting alcohol use. Practicing safe sex. Taking low-dose aspirin every day. Taking vitamin and mineral supplements as recommended by your health care provider. What happens  during an annual well check? The services and screenings done by your health care provider during your annual well check will depend on your age, overall health, lifestyle risk factors, and family history of disease. Counseling  Your health care provider may ask you questions about your: Alcohol use. Tobacco use. Drug use. Emotional well-being. Home and relationship well-being. Sexual activity. Eating habits. History of falls. Memory and ability to understand (cognition). Work and work Statistician. Reproductive health. Screening  You may have the following tests or measurements: Height, weight, and BMI. Blood pressure. Lipid and cholesterol levels. These may be checked every 5 years, or more frequently if you are over 28 years old. Skin check. Lung cancer screening. You may have this screening every year starting at age 73 if you have a 30-pack-year history of smoking and currently smoke or have quit within the past 15 years. Fecal occult blood test (FOBT) of the stool. You may have this test every year starting at age 15. Flexible sigmoidoscopy or colonoscopy. You may have a sigmoidoscopy every 5 years or a colonoscopy every 10 years starting at age 86. Hepatitis C blood test. Hepatitis B blood test. Sexually transmitted disease (STD) testing. Diabetes screening. This is done by checking your blood sugar (glucose) after you have not eaten for a while (fasting). You may have this done every 1-3 years. Bone density scan. This is done to screen for osteoporosis. You may have this done starting at age 73. Mammogram. This may be done every 1-2 years. Talk to your health care provider about how often you should have regular mammograms. Talk with your health care provider about your test results, treatment options, and if necessary, the need for more  tests. Vaccines  Your health care provider may recommend certain vaccines, such as: Influenza vaccine. This is recommended every  year. Tetanus, diphtheria, and acellular pertussis (Tdap, Td) vaccine. You may need a Td booster every 10 years. Zoster vaccine. You may need this after age 76. Pneumococcal 13-valent conjugate (PCV13) vaccine. One dose is recommended after age 37. Pneumococcal polysaccharide (PPSV23) vaccine. One dose is recommended after age 27. Talk to your health care provider about which screenings and vaccines you need and how often you need them. This information is not intended to replace advice given to you by your health care provider. Make sure you discuss any questions you have with your health care provider. Document Released: 08/03/2015 Document Revised: 03/26/2016 Document Reviewed: 05/08/2015 Elsevier Interactive Patient Education  2017 [place] Prevention in the Home Falls can cause injuries. They can happen to people of all ages. There are many things you can do to make your home safe and to help prevent falls. What can I do on the outside of my home? Regularly fix the edges of walkways and driveways and fix any cracks. Remove anything that might make you trip as you walk through a door, such as a raised step or threshold. Trim any bushes or trees on the path to your home. Use bright outdoor lighting. Clear any walking paths of anything that might make someone trip, such as rocks or tools. Regularly check to see if handrails are loose or broken. Make sure that both sides of any steps have handrails. Any raised decks and porches should have guardrails on the edges. Have any leaves, snow, or ice cleared regularly. Use sand or salt on walking paths during winter. Clean up any spills in your garage right away. This includes oil or grease spills. What can I do in the bathroom? Use night lights. Install grab bars by the toilet and in the tub and shower. Do not use towel bars as grab bars. Use non-skid mats or decals in the tub or shower. If you need to sit down in the shower, use a  plastic, non-slip stool. Keep the floor dry. Clean up any water that spills on the floor as soon as it happens. Remove soap buildup in the tub or shower regularly. Attach bath mats securely with double-sided non-slip rug tape. Do not have throw rugs and other things on the floor that can make you trip. What can I do in the bedroom? Use night lights. Make sure that you have a light by your bed that is easy to reach. Do not use any sheets or blankets that are too big for your bed. They should not hang down onto the floor. Have a firm chair that has side arms. You can use this for support while you get dressed. Do not have throw rugs and other things on the floor that can make you trip. What can I do in the kitchen? Clean up any spills right away. Avoid walking on wet floors. Keep items that you use a lot in easy-to-reach places. If you need to reach something above you, use a strong step stool that has a grab bar. Keep electrical cords out of the way. Do not use floor polish or wax that makes floors slippery. If you must use wax, use non-skid floor wax. Do not have throw rugs and other things on the floor that can make you trip. What can I do with my stairs? Do not leave any items on the stairs. Make sure  that there are handrails on both sides of the stairs and use them. Fix handrails that are broken or loose. Make sure that handrails are as long as the stairways. Check any carpeting to make sure that it is firmly attached to the stairs. Fix any carpet that is loose or worn. Avoid having throw rugs at the top or bottom of the stairs. If you do have throw rugs, attach them to the floor with carpet tape. Make sure that you have a light switch at the top of the stairs and the bottom of the stairs. If you do not have them, ask someone to add them for you. What else can I do to help prevent falls? Wear shoes that: Do not have high heels. Have rubber bottoms. Are comfortable and fit you  well. Are closed at the toe. Do not wear sandals. If you use a stepladder: Make sure that it is fully opened. Do not climb a closed stepladder. Make sure that both sides of the stepladder are locked into place. Ask someone to hold it for you, if possible. Clearly mark and make sure that you can see: Any grab bars or handrails. First and last steps. Where the edge of each step is. Use tools that help you move around (mobility aids) if they are needed. These include: Canes. Walkers. Scooters. Crutches. Turn on the lights when you go into a dark area. Replace any light bulbs as soon as they burn out. Set up your furniture so you have a clear path. Avoid moving your furniture around. If any of your floors are uneven, fix them. If there are any pets around you, be aware of where they are. Review your medicines with your doctor. Some medicines can make you feel dizzy. This can increase your chance of falling. Ask your doctor what other things that you can do to help prevent falls. This information is not intended to replace advice given to you by your health care provider. Make sure you discuss any questions you have with your health care provider. Document Released: 05/03/2009 Document Revised: 12/13/2015 Document Reviewed: 08/11/2014 Elsevier Interactive Patient Education  2017 [employer].

## 2021-04-04 DIAGNOSIS — Z803 Family history of malignant neoplasm of breast: Secondary | ICD-10-CM | POA: Diagnosis not present

## 2021-04-04 DIAGNOSIS — Z780 Asymptomatic menopausal state: Secondary | ICD-10-CM | POA: Diagnosis not present

## 2021-04-04 DIAGNOSIS — C50511 Malignant neoplasm of lower-outer quadrant of right female breast: Secondary | ICD-10-CM | POA: Diagnosis not present

## 2021-04-04 DIAGNOSIS — C499 Malignant neoplasm of connective and soft tissue, unspecified: Secondary | ICD-10-CM | POA: Diagnosis not present

## 2021-04-04 DIAGNOSIS — Z08 Encounter for follow-up examination after completed treatment for malignant neoplasm: Secondary | ICD-10-CM | POA: Diagnosis not present

## 2021-04-04 DIAGNOSIS — Z853 Personal history of malignant neoplasm of breast: Secondary | ICD-10-CM | POA: Diagnosis not present

## 2021-04-04 DIAGNOSIS — Z800 Family history of malignant neoplasm of digestive organs: Secondary | ICD-10-CM | POA: Diagnosis not present

## 2021-04-25 ENCOUNTER — Encounter: Payer: Self-pay | Admitting: Family

## 2021-04-25 ENCOUNTER — Telehealth (INDEPENDENT_AMBULATORY_CARE_PROVIDER_SITE_OTHER): Payer: PPO | Admitting: Family

## 2021-04-25 DIAGNOSIS — U071 COVID-19: Secondary | ICD-10-CM | POA: Insufficient documentation

## 2021-04-25 MED ORDER — NIRMATRELVIR/RITONAVIR (PAXLOVID)TABLET: 3.0000 | ORAL_TABLET | Freq: Two times a day (BID) | ORAL | 0 refills | Status: AC

## 2021-04-25 NOTE — Progress Notes (Signed)
MyChart Video Visit    Virtual Visit via Video Note   This visit type was conducted due to national recommendations for restrictions regarding the COVID-19 Pandemic (e.g. social distancing) in an effort to limit this patient's exposure and mitigate transmission in our community. This patient is at least at moderate risk for complications without adequate follow up. This format is felt to be most appropriate for this patient at this time. Physical exam was limited by quality of the video and audio technology used for the visit. CMA was able to get the patient set up on a video visit.  Patient location: Home. Patient and provider in visit Provider location: Office  I discussed the limitations of evaluation and management by telemedicine and the availability of in person appointments. The patient expressed understanding and agreed to proceed.  Visit Date: 04/25/2021  Today's healthcare provider: Jeanie Sewer, NP     Subjective:    Patient ID: Kathy Joseph, female    DOB: 09-Jul-1956, 65 y.o.   MRN: [id number]  Chief Complaint  Patient presents with  . Covid Positive    Symptoms started yesterday afternoon. Morning started with congestion. Took test this morning. She took Theraflu.   Marland Kitchen Headache  . Nasal Congestion  . Chills  . Sore Throat    Mild    HPI Pt reports having sore throat, chills, sinus congestion and drainage, headache, no cough, starting yesterday. Tested positive for Covid19 this morning.      Outpatient Medications Prior to Visit  Medication Sig Dispense Refill  . Cholecalciferol (VITAMIN D3) 10 MCG (400 UNIT) tablet Take 400 Units by mouth daily.    . Eflornithine HCl ([name]) 13.9 % cream Apply 1 application topically daily. 45 g 2  . Folic Acid-Vit D6-UYQ I34 (HOMOCYSTEINE FORMULA) 0.8-50-0.1 MG TABS     . Magnesium 250 MG TABS Take by mouth.    . Misc Natural Products (GLUCOSAMINE CHOND MSM FORMULA PO) Take by mouth.    . multivitamin-lutein  (OCUVITE-LUTEIN) CAPS capsule Take 1 capsule by mouth daily.    . Probiotic Product (PROBIOTIC PEARLS ADVANTAGE PO) Take by mouth.    Marland Kitchen LORazepam (ATIVAN) 1 MG tablet Take 1 mg by mouth daily as needed. (Patient not taking: No sig reported)    . PFIZER-BIONT COVID-19 VAC-TRIS SUSP injection      No facility-administered medications prior to visit.    Allergies  Allergen Reactions  . Cephalosporins Hives    ROS See HPI above.     Objective:    Physical Exam Vitals and nursing note reviewed.  Constitutional:      General: She is not in acute distress.    Appearance: Normal appearance.  HENT:     Head: Normocephalic.  Pulmonary:     Effort: No respiratory distress.  Skin:    General: Skin is dry.     Coloration: Skin is not pale.  Neurological:     Mental Status: She is alert and oriented to person, place, and time.  Psychiatric:        Mood and Affect: Mood normal.    Temp 98 F (36.7 C) (Temporal) Comment: Pt reported  Ht 5\' 7"  (1.702 m)   Wt 235 lb 3.7 oz (106.7 kg)   SpO2 96% Comment: Pt reported  BMI 36.84 kg/m  Wt Readings from Last 3 Encounters:  04/25/21 235 lb 3.7 oz (106.7 kg)  03/05/21 235 lb 4.8 oz (106.7 kg)  01/29/21 232 lb 8 oz (105.5 kg)  Assessment & Plan:   Problem List Items Addressed This Visit       Other   COVID-19    Advised of CDC guidelines for self isolation/ ending isolation.  Advised of safe practice guidelines. Symptom Tier reviewed.  Encouraged to monitor for any worsening symptoms; watch for increased shortness of breath, weakness, and signs of dehydration. Advised when to seek emergency care.  Instructed to rest and hydrate well.  Advised to leave the house during recommended isolation period, only if it is necessary to seek medical care        I am having Lama Beringer "Kathy Joseph" maintain her [name], Vitamin D3, multivitamin-lutein, Magnesium, Misc Natural Products (GLUCOSAMINE CHOND MSM FORMULA PO), Probiotic Product  (PROBIOTIC PEARLS ADVANTAGE PO), Homocysteine Formula, LORazepam, and Pfizer-BioNT COVID-19 Vac-TriS.  No orders of the defined types were placed in this encounter.   I discussed the assessment and treatment plan with the patient. The patient was provided an opportunity to ask questions and all were answered. The patient agreed with the plan and demonstrated an understanding of the instructions.   The patient was advised to call back or seek an in-person evaluation if the symptoms worsen or if the condition fails to improve as anticipated.  I provided 22 minutes of face-to-face time during this encounter.   Jeanie Sewer, NP [place] [phone] (phone) [phone] (fax)  [place]

## 2021-04-25 NOTE — Patient Instructions (Signed)
Paxlovid has been sent to your pharmacy, start taking today. OK to continue generic Tylenol or Ibuprofen for any aches, pain, or fever. Generic Mucinex to help with chest congestion. Drink plenty of water. Follow CDC guidelines for quarantining as attached. Call office if symptoms are not improving or worsening.

## 2021-04-25 NOTE — Assessment & Plan Note (Signed)
Advised of CDC guidelines for self isolation/ ending isolation.  Advised of safe practice guidelines. Symptom Tier reviewed.  Encouraged to monitor for any worsening symptoms; watch for increased shortness of breath, weakness, and signs of dehydration. Advised when to seek emergency care.  Instructed to rest and hydrate well.  Advised to leave the house during recommended isolation period, only if it is necessary to seek medical care  

## 2021-08-01 ENCOUNTER — Ambulatory Visit: Payer: PPO | Admitting: Oncology

## 2021-08-08 ENCOUNTER — Encounter: Payer: PPO | Admitting: Physician Assistant

## 2021-08-12 DIAGNOSIS — D485 Neoplasm of uncertain behavior of skin: Secondary | ICD-10-CM | POA: Diagnosis not present

## 2021-08-21 NOTE — Progress Notes (Signed)
[place]  [address] [place],  [place]  [address] [phone]  Clinic Day:  08/27/2021  Referring physician: Vivi Barrack, MD   This document serves as a record of services personally performed by Hosie Poisson, MD. It was created on their behalf by [facility], a trained medical scribe. The creation of this record is based on the scribe's personal observations and the provider's statements to them.  CHIEF COMPLAINT:  CC: History of mammary angiosarcoma   Current Treatment:  Surveillance   HISTORY OF PRESENT ILLNESS:  Kathy Joseph is a 66 y.o. female with mammary angiosarcoma of the right breast, diagnosed in August 2021.  This was found on a screening mammogram in November 2020, where she was found to have possible asymmetry in the right breast.  Diagnostic right mammogram and ultrasound revealed the asymmetry to persist on additional views, but ultrasound did not reveal any suspicious masses.  There were areas of focal fibroglandular tissue that may correspond to the abnormality.  Six-month follow-up right diagnostic mammogram and ultrasound were recommended.  In May 2021, there was an increase in the focal asymmetry in the right outer breast.  Ultrasound revealed an irregular mass at 8 o'clock 6 cm from the nipple measuring 1.9 x 1.7 x 1.1 cm.  No enlarged axillary lymph nodes were seen.  Biopsy revealed an angiolipoma with a differential diagnosis of fat necrosis versus malignancy.  The radiologist felt the findings were concordant, but as he was still concerned about the appearance on ultrasound, excision was recommended, so she was referred to Dr. Lilia Pro.  She underwent needle localized lumpectomy on August 21st and surgical pathology from this procedure revealed mammary angiosarcoma.  These results were sent to [facility], and confirmed.  Since even low grade lesions of this type have a substantial risk of distant metastasis, tumors of this  type are generally treated by mastectomy.  CT chest from September 1st revealed no evidence of metastatic disease in the chest.  Bilateral calcified pulmonary nodules are consistent with prior granulomatous disease.  There is a 9 mm subpleural nodule in the left upper lobe with curvilinear calcifications.  Regardless this appears benign.  Multiple fat density lesions involving both kidneys are consistent with multiple angiomyolipomas.  There is also an indeterminate 15 mm lesion arising from the posterior right kidney, which may represent a lipid poor angiomyolipoma.  Further characterization with renal protocol MRI was recommended.  Hypodense lesion in the left lobe of the thyroid gland measuring 12 mm.  Given size, this is not clinically significant.  She has been seen at [facility] in consultation and will be returning for their recommendations.  She started menarche at age 75.  She has had 4 pregnancies, with 3 live births.  Her 1st live birth was at age 40.  She was placed on tamoxifen as chemoprevention for 2-3 years, but this was discontinued after she developed a superficial venous thrombosis of the left leg.  She took oral contraceptives for 9 years after her last child.  The professors at [facility] reviewed the surgical pathology from her procedure, and they did not recommend any further excision at this time as her margins were clear.  The closest margin was 1.5 mm.  They did recommend close follow up with breast MRI and CT imaging every 6 months for the next 2 years. CT and MRI imaging from March 2022 at [facility] are benign.   INTERVAL HISTORY:  Kathy Joseph is here for routine follow  up and states that she is doing well and denies complaints today. She does have some lipomas that have developed of the upper right extremity. Since her last visit, she did contract COVID and has recovered nicely. Bilateral mammogram from September was clear. She continues close follow up at [facility] and they plan CT and MRI imaging  in March. She also continues follow up with Dr. Lilia Pro. She undergoes routine lab work with her PCP. Her  appetite is good, and she has gained 2 pounds since her last visit.  She denies fever, chills or other signs of infection.  She denies nausea, vomiting, bowel issues, or abdominal pain.  She denies sore throat, cough, dyspnea, or chest pain.  Review of Systems  Constitutional: Negative.  Negative for appetite change, chills, fatigue, fever and unexpected weight change.  HENT:  Negative.    Eyes: Negative.   Respiratory: Negative.  Negative for chest tightness, cough, hemoptysis, shortness of breath and wheezing.   Cardiovascular: Negative.  Negative for chest pain, leg swelling and palpitations.  Gastrointestinal: Negative.  Negative for abdominal distention, abdominal pain, blood in stool, constipation, diarrhea, nausea and vomiting.  Endocrine: Negative.   Genitourinary: Negative.  Negative for difficulty urinating, dysuria, frequency and hematuria.   Musculoskeletal: Negative.  Negative for arthralgias, back pain, flank pain, gait problem and myalgias.  Skin: Negative.   Neurological: Negative.  Negative for dizziness, extremity weakness, gait problem, headaches, light-headedness, numbness, seizures and speech difficulty.  Hematological: Negative.   Psychiatric/Behavioral: Negative.  Negative for depression and sleep disturbance. The patient is not nervous/anxious.     VITALS:  Blood pressure 124/65, pulse (!) 59, temperature 97.6 F (36.4 C), temperature source Oral, resp. rate 18, height 5' 7"  (1.702 m), weight 237 lb 6.4 oz (107.7 kg), SpO2 97 %.  Wt Readings from Last 3 Encounters:  08/27/21 237 lb 6.4 oz (107.7 kg)  04/25/21 235 lb 3.7 oz (106.7 kg)  03/05/21 235 lb 4.8 oz (106.7 kg)    Body mass index is 37.18 kg/m.  Performance status (ECOG): 0 - Asymptomatic  PHYSICAL EXAM:  Physical Exam Constitutional:      General: She is not in acute distress.    Appearance:  Normal appearance. She is normal weight.  HENT:     Head: Normocephalic and atraumatic.  Eyes:     General: No scleral icterus.    Extraocular Movements: Extraocular movements intact.     Conjunctiva/sclera: Conjunctivae normal.     Pupils: Pupils are equal, round, and reactive to light.  Cardiovascular:     Rate and Rhythm: Regular rhythm. Bradycardia present.     Pulses: Normal pulses.     Heart sounds: Normal heart sounds. No murmur heard.   No friction rub. No gallop.  Pulmonary:     Effort: Pulmonary effort is normal. No respiratory distress.     Breath sounds: Normal breath sounds.  Chest:     Comments: Faint nodularity of the scar in the lower outer quadrant of the right breast but no masses in either breast. Abdominal:     General: Bowel sounds are normal. There is no distension.     Palpations: Abdomen is soft. There is no hepatomegaly, splenomegaly or mass.     Tenderness: There is no abdominal tenderness.  Musculoskeletal:        General: Normal range of motion.     Cervical back: Normal range of motion and neck supple.     Right lower leg: Edema (mild) present.  Left lower leg: Edema (mild) present.  Lymphadenopathy:     Cervical: No cervical adenopathy.  Skin:    General: Skin is warm and dry.     Comments: Several small lipomas of the right upper extremity.  Neurological:     General: No focal deficit present.     Mental Status: She is alert and oriented to person, place, and time. Mental status is at baseline.  Psychiatric:        Mood and Affect: Mood normal.        Behavior: Behavior normal.        Thought Content: Thought content normal.        Judgment: Judgment normal.    LABS:   CBC Latest Ref Rng & Units 01/29/2021 04/03/2020 03/14/2019  WBC - 3.6 3.4 5.7  Hemoglobin 12.0 - 16.0 14.3 13.7 13.7  Hematocrit 36 - 46 40 41 40.4  Platelets 150 - 399 184 176 185.0   CMP Latest Ref Rng & Units 01/29/2021 08/02/2020 04/03/2020  Glucose 70 - 99 mg/dL - - -   BUN 4 - 21 16 16 15   Creatinine 0.5 - 1.1 0.7 1.0 0.8  Sodium 137 - 147 136(A) 138 138  Potassium 3.4 - 5.3 4.5 4.3 4.4  Chloride 99 - 108 104 104 104  CO2 13 - 22 22 30(A) 28(A)  Calcium 8.7 - 10.7 9.1 9.3 9.1  Total Protein 6.0 - 8.3 g/dL - - -  Total Bilirubin 0.2 - 1.2 mg/dL - - -  Alkaline Phos 25 - 125 91 88 83  AST 13 - 35 31 30 28   ALT 7 - 35 25 22 20     STUDIES:   EXAM: MAMMO DIGITAL DIAGNOSTIC BILATERAL  DATE: 04/04/2021 1:12 PM  ACCESSION: [id number]  DICTATED: 04/04/2021 1:39 PM  INTERPRETATION LOCATION: [place]   CLINICAL INDICATION: 66 years old Female with PERSONAL HX MALIG NEOPLASM BREAST  - C49.9 - Angiosarcoma (CMS - HCC) - C50.521 - Malignant neoplasm of lower - outer quadrant of right breast, unspecified estrogen receptor status (CMS - [place]).  History of excision of right breast angiosarcoma 02/2020.   COMPARISON: Outside mammograms 2021, 2020   TECHNIQUE: Full-field digital diagnostic and spot 2D/3D mammogram.   Tomosynthesis imaging was used to improve the sensitivity and specificity of the detection of breast cancer.   BREAST DENSITY: a - The breasts are almost entirely fatty.   FINDINGS:    There are postsurgical changes in the right breast.  There is no suspicious mass, calcifications, or other abnormality in either breast.   IMPRESSION:  No mammographic evidence of malignancy.   Allergies:  Allergies  Allergen Reactions   Cephalosporins Hives    Current Medications: Current Outpatient Medications  Medication Sig Dispense Refill   Cholecalciferol (VITAMIN D3) 10 MCG (400 UNIT) tablet Take 400 Units by mouth daily.     Eflornithine HCl ([name]) 13.9 % cream Apply 1 application topically daily. 45 g 2   FLUZONE HIGH-DOSE QUADRIVALENT 0.7 ML SUSY      Folic Acid-Vit W4-OXB D53 (HOMOCYSTEINE FORMULA) 0.8-50-0.1 MG TABS      Magnesium 250 MG TABS Take by mouth.     Misc Natural Products (GLUCOSAMINE CHOND MSM FORMULA PO) Take by mouth.      multivitamin-lutein (OCUVITE-LUTEIN) CAPS capsule Take 1 capsule by mouth daily.     PFIZER COVID-19 VAC BIVALENT injection      PFIZER-BIONT COVID-19 VAC-TRIS SUSP injection      Probiotic Product (PROBIOTIC PEARLS ADVANTAGE  PO) Take by mouth.     No current facility-administered medications for this visit.     ASSESSMENT & PLAN:   Assessment:   1.  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  She has undergone lumpectomy with clear margins.  [facility] has not recommended further surgical excision.  She will be undergoing MRI breast and CT imaging every 6 months for close follow up. This is scheduled for March.  2.  Strong family history of breast cancer.  The patient met with Vida Roller to discuss genetic testing and [facility] Multi Cancer Panel test was pursued.  The results of the patient's genetic testing was negative-no clinically significant mutation identified.  There was a variant of uncertain significance (VUS) found in the [facility] gene.  These findings and their significance were reviewed with the patient.  3.  History of melanoma in situ of the lower right calf, diagnosed by Dr. Marge Duncans, dermatology in [place].  This was treated with wide excision.  She follows up with dermatology routinely.     4.  Multiple angiomyolipomas of bilateral kidneys on CT scan.  There is also an indeterminate 15 mm lesion arising from the posterior right kidney.  This may represent a lipid poor angiomyolipoma, but further characterization with renal protocol MRI was recommended.  5.  Multiple benign lipomas of the skin, mainly involving the right upper extremity.  6.  Colon polyps and familial history of colon cancer.  She should have a repeat colonoscopy in the next 1-2 years.    Plan: She is scheduled for CT and MRI imaging in March at [facility]. She continues to follow closely every 3 months between here, [facility] and Dr. Pietro Cassis office. We will see her back in 6 months with repeat  CBC, CMP and evaluation. She verbalizes understanding of and agreement to the plans discussed today. She knows to call the office should any new questions or concerns arise.   I provided 15 minutes of face-to-face time during this this encounter and > 50% was spent counseling as documented under my assessment and plan.    Derwood Kaplan, MD [facility] AT [facility] [address] [place] [address] Dept: [phone] Dept Fax: [phone]    I, Rita Ohara, am acting as scribe for Derwood Kaplan, MD  I have reviewed this report as typed by the medical scribe, and it is complete and accurate.

## 2021-08-27 ENCOUNTER — Inpatient Hospital Stay: Payer: PPO | Attending: Oncology | Admitting: Oncology

## 2021-08-27 ENCOUNTER — Telehealth: Payer: Self-pay | Admitting: Oncology

## 2021-08-27 ENCOUNTER — Other Ambulatory Visit: Payer: Self-pay | Admitting: Oncology

## 2021-08-27 ENCOUNTER — Encounter: Payer: Self-pay | Admitting: Oncology

## 2021-08-27 ENCOUNTER — Other Ambulatory Visit: Payer: Self-pay

## 2021-08-27 VITALS — BP 124/65 | HR 59 | Temp 97.6°F | Resp 18 | Ht 67.0 in | Wt 237.4 lb

## 2021-08-27 DIAGNOSIS — C50919 Malignant neoplasm of unspecified site of unspecified female breast: Secondary | ICD-10-CM

## 2021-08-27 NOTE — Telephone Encounter (Signed)
Per 08/27/21 los next appt scheduled and confirmed with patient °

## 2021-08-28 ENCOUNTER — Ambulatory Visit: Payer: PPO | Admitting: Physician Assistant

## 2021-09-09 DIAGNOSIS — D2239 Melanocytic nevi of other parts of face: Secondary | ICD-10-CM | POA: Diagnosis not present

## 2021-09-09 DIAGNOSIS — L820 Inflamed seborrheic keratosis: Secondary | ICD-10-CM | POA: Diagnosis not present

## 2021-09-09 DIAGNOSIS — D485 Neoplasm of uncertain behavior of skin: Secondary | ICD-10-CM | POA: Diagnosis not present

## 2021-09-09 DIAGNOSIS — D225 Melanocytic nevi of trunk: Secondary | ICD-10-CM | POA: Diagnosis not present

## 2021-10-03 DIAGNOSIS — I728 Aneurysm of other specified arteries: Secondary | ICD-10-CM | POA: Diagnosis not present

## 2021-10-03 DIAGNOSIS — N649 Disorder of breast, unspecified: Secondary | ICD-10-CM | POA: Diagnosis not present

## 2021-10-03 DIAGNOSIS — C499 Malignant neoplasm of connective and soft tissue, unspecified: Secondary | ICD-10-CM | POA: Diagnosis not present

## 2021-10-14 IMAGING — MG MM DIGITAL DIAGNOSTIC UNILAT*R* W/ TOMO W/ CAD
4 series · 4 of 12 positions shown · non-contrast
Comparison: Previous exam(s).

CLINICAL DATA: Follow-up probably benign asymmetry the outer right
breast with no ultrasound correlate other than focal fibroglandular
tissue.

EXAM:
DIGITAL DIAGNOSTIC RIGHT MAMMOGRAM WITH CAD AND TOMO
ULTRASOUND RIGHT BREAST

[R CC synth-2D]
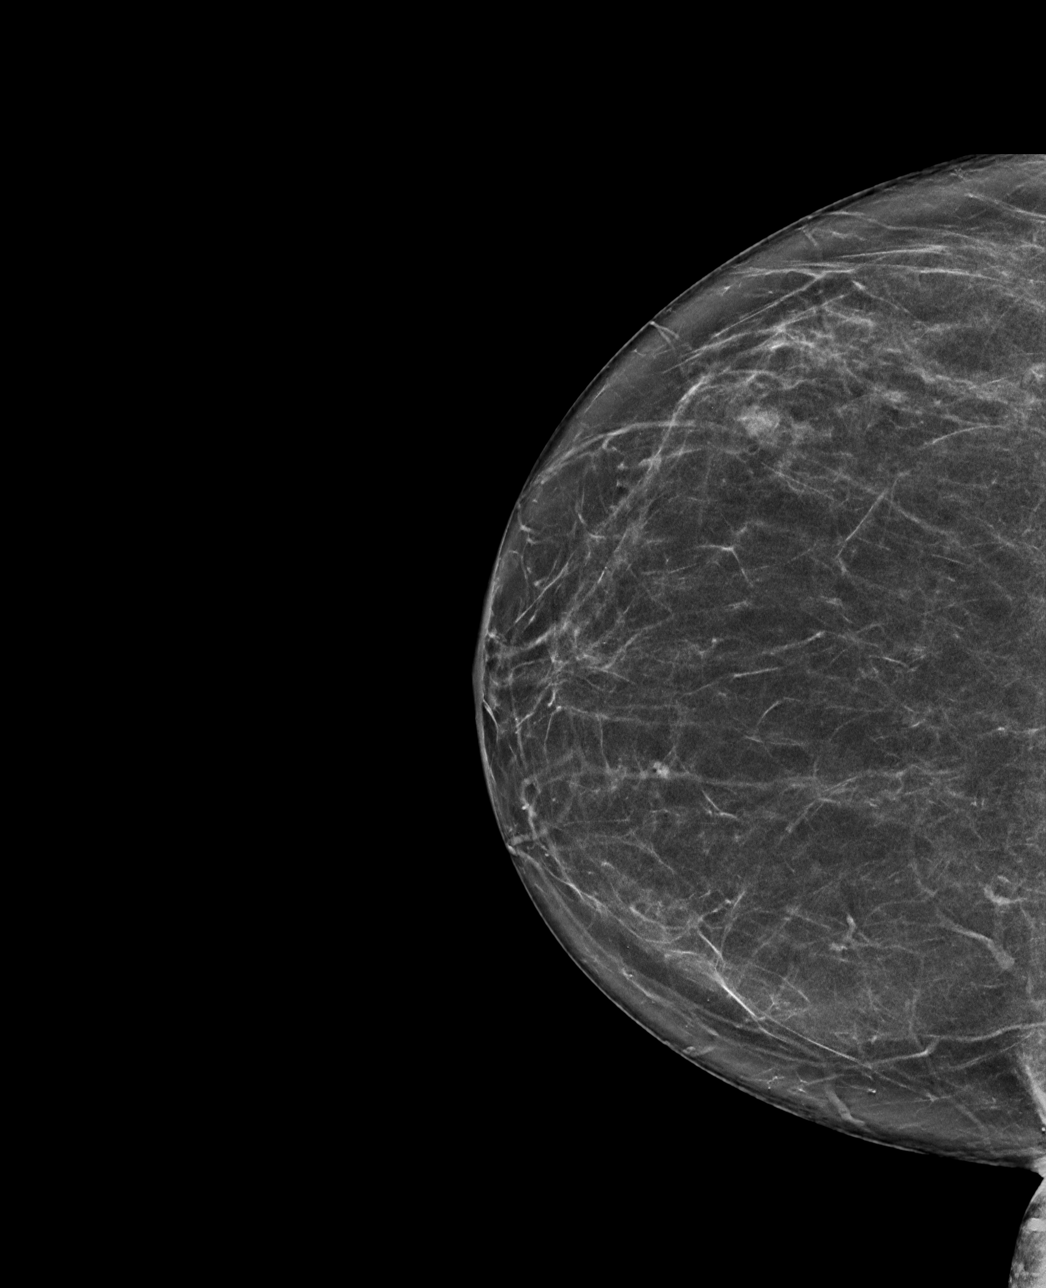

[R MLO synth-2D]
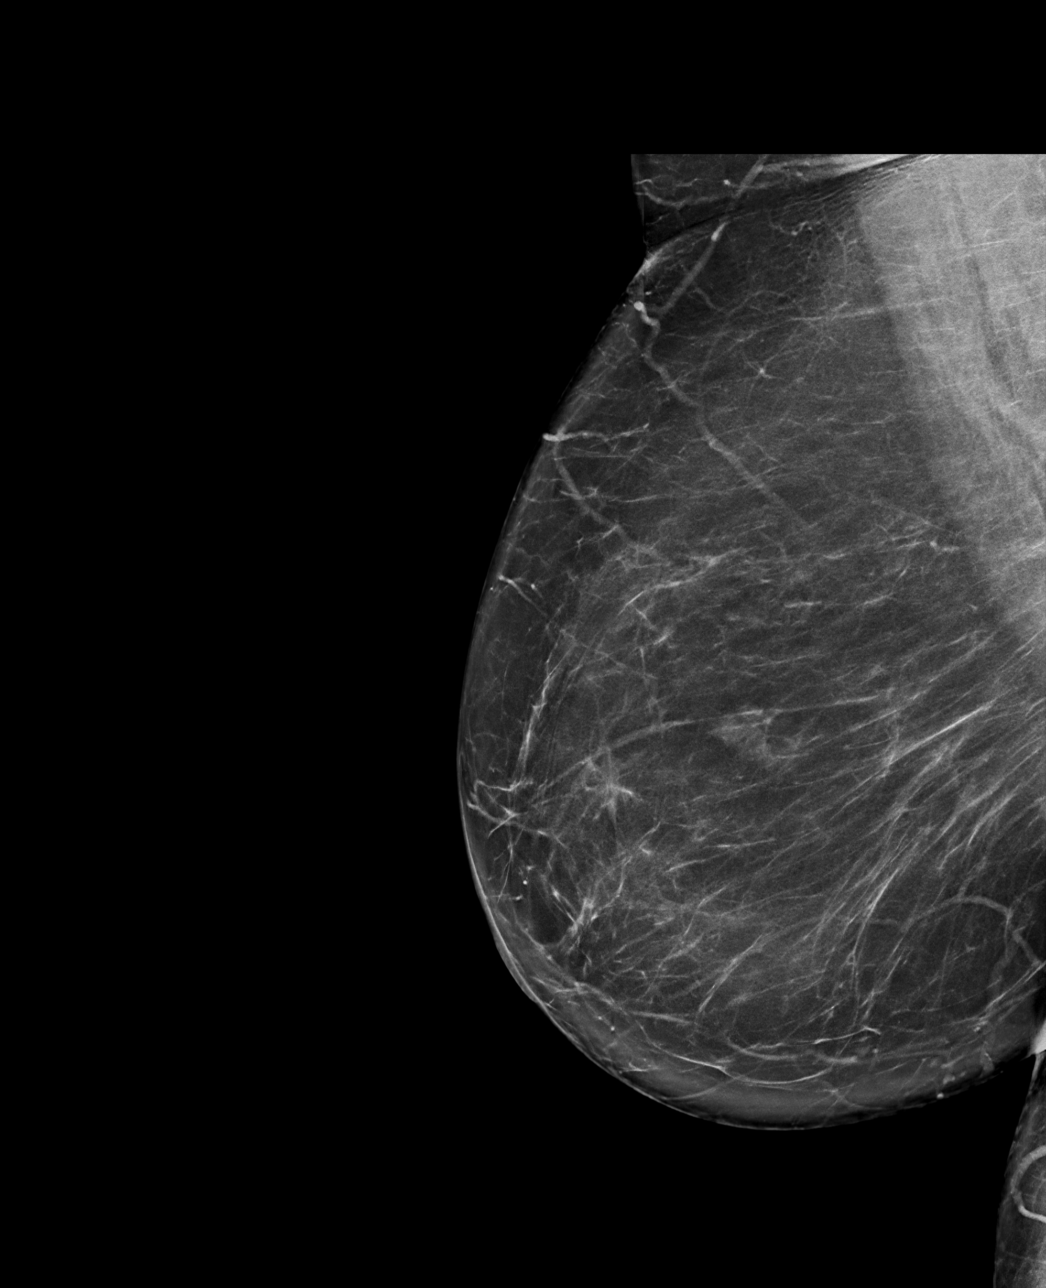

[R MLO tomo · tomo slice 43/86.0]
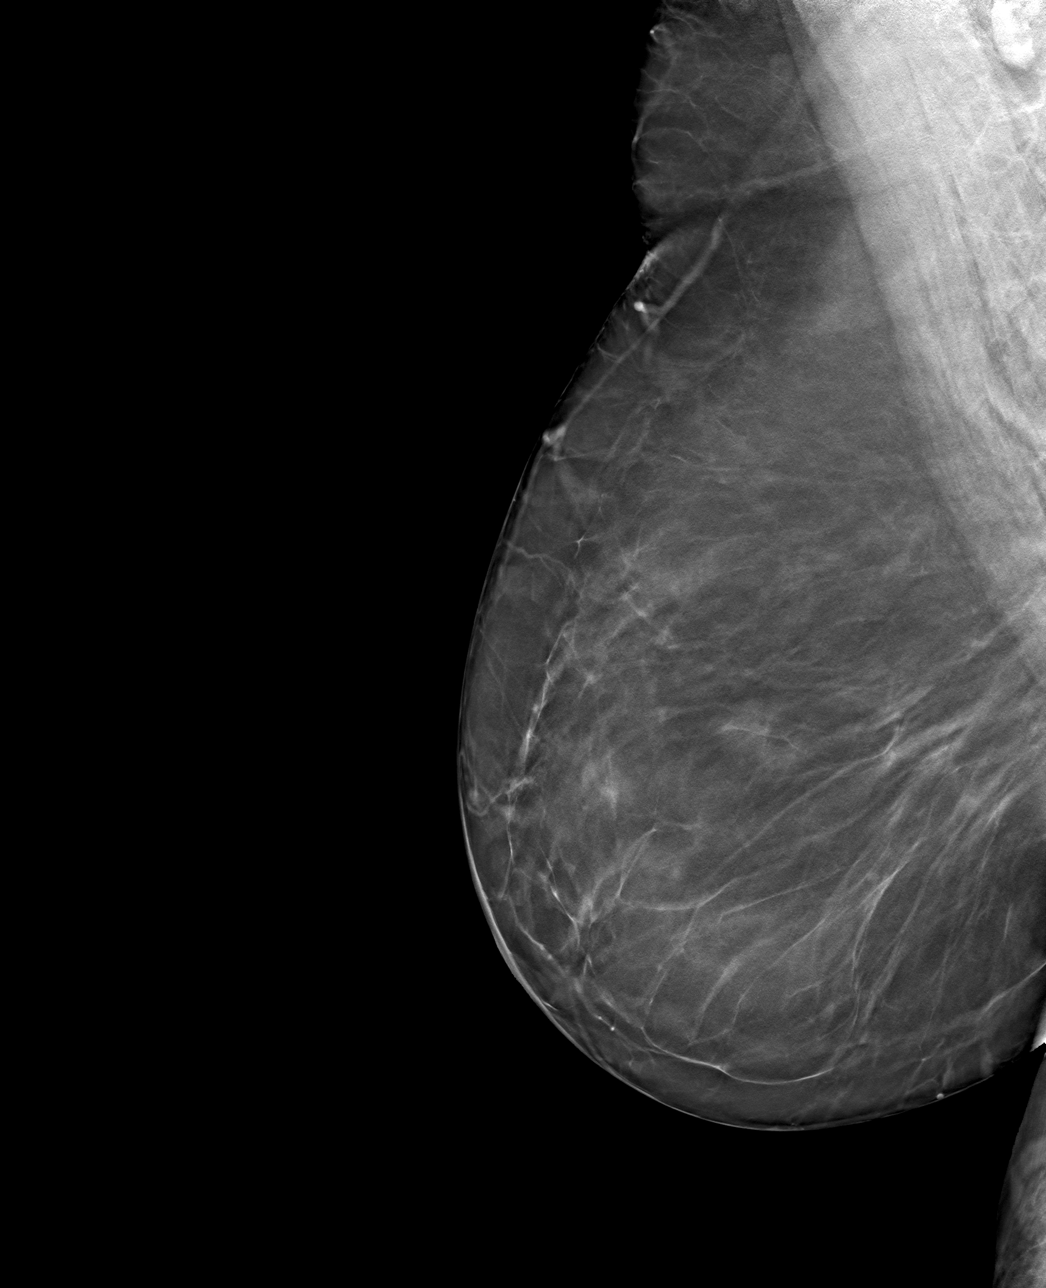

[R CC tomo · tomo slice 35/70.0]
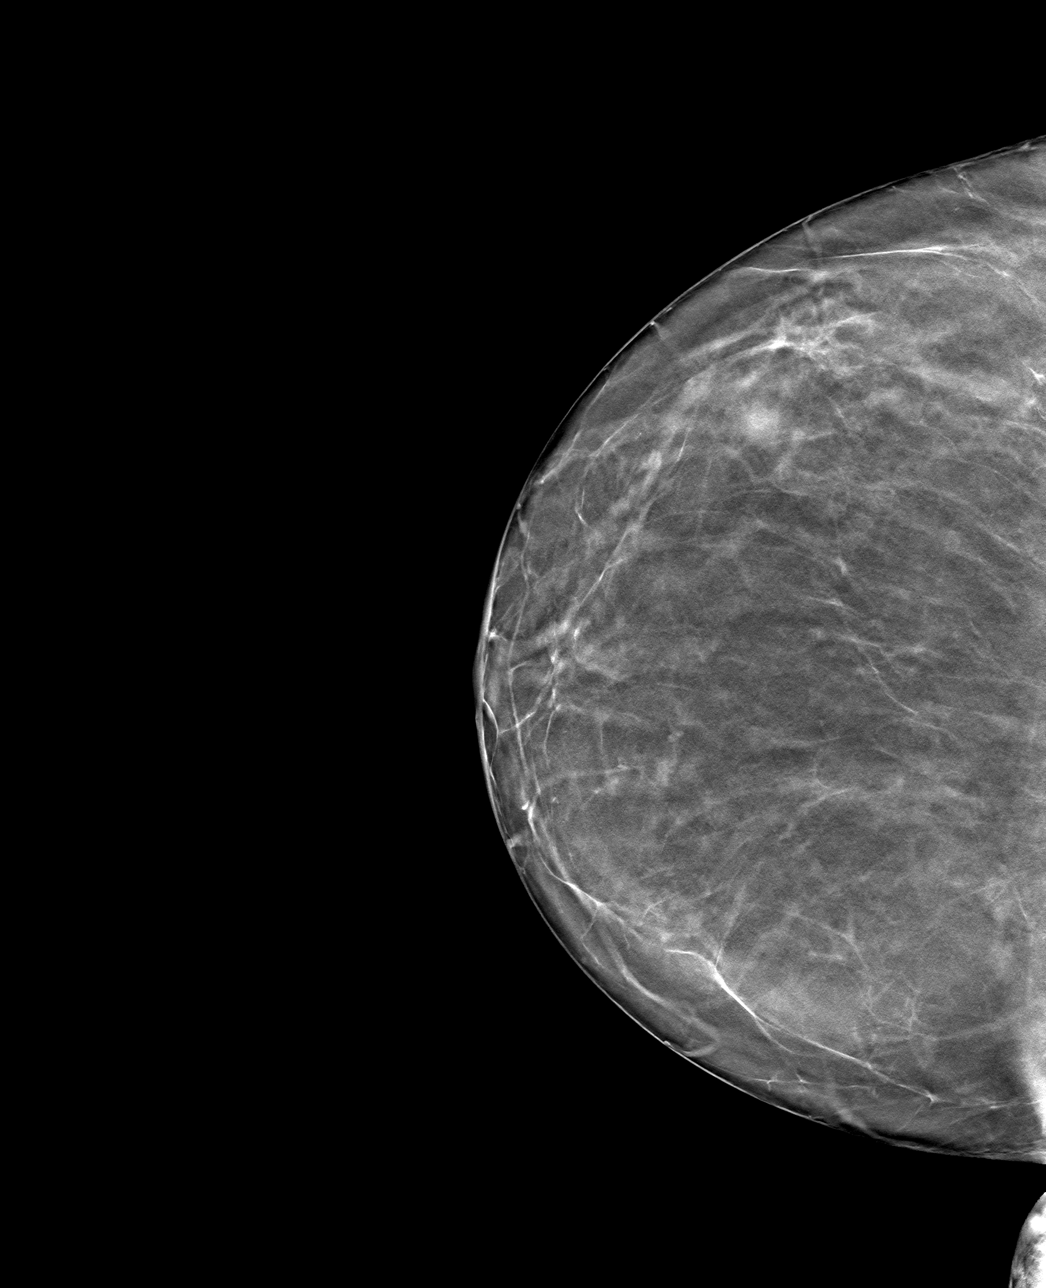

[4 of 12 positions shown; findings below may reference images not displayed]

ACR Breast Density Category b: There are scattered areas of
fibroglandular density.
FINDINGS: Interval increase in size of the previously demonstrated focal
asymmetry in the outer right breast. This is currently tri-lobed in
appearance in the oblique projection and has some circumscribed and
some indistinct margins. No mammographic findings elsewhere in the
breast suspicious for malignancy.

Mammographic images were processed with CAD.

On physical exam, no mass is palpable in the outer right breast or
right axilla.

Targeted ultrasound is performed, showing an irregular, mildly
heterogeneous, predominantly echogenic mass in the 8 o'clock
position of the right breast, 6 cm from the nipple. This measures
1.9 x 1.7 x 1.1 cm in maximum dimensions. This has internal blood
flow with power Doppler.

Ultrasound of the right axilla demonstrated normal appearing right
axillary lymph nodes.
IMPRESSION: 1.9 cm mass in the 8 o'clock position of the right breast with
mammographic and ultrasound features suspicious for malignancy.

RECOMMENDATION:
Ultrasound-guided core needle biopsy of the 1.9 cm mass in the 8
o'clock position of the right breast. This has been discussed the
patient and scheduled at [DATE] p.m. on 12/13/2019.

I have discussed the findings and recommendations with the patient.
If applicable, a reminder letter will be sent to the patient
regarding the next appointment.

BI-RADS CATEGORY  4: Suspicious.

## 2021-10-15 ENCOUNTER — Telehealth: Payer: Self-pay

## 2021-10-15 DIAGNOSIS — C50911 Malignant neoplasm of unspecified site of right female breast: Secondary | ICD-10-CM | POA: Diagnosis not present

## 2021-10-15 IMAGING — MG MM BREAST LOCALIZATION CLIP
4 series · 4 of 12 positions shown · non-contrast
Comparison: Previous exam(s).

CLINICAL DATA: 64-year-old female presenting for biopsy of a right
breast mass.

EXAM:
DIAGNOSTIC RIGHT MAMMOGRAM POST ULTRASOUND BIOPSY

[R ML synth-2D]
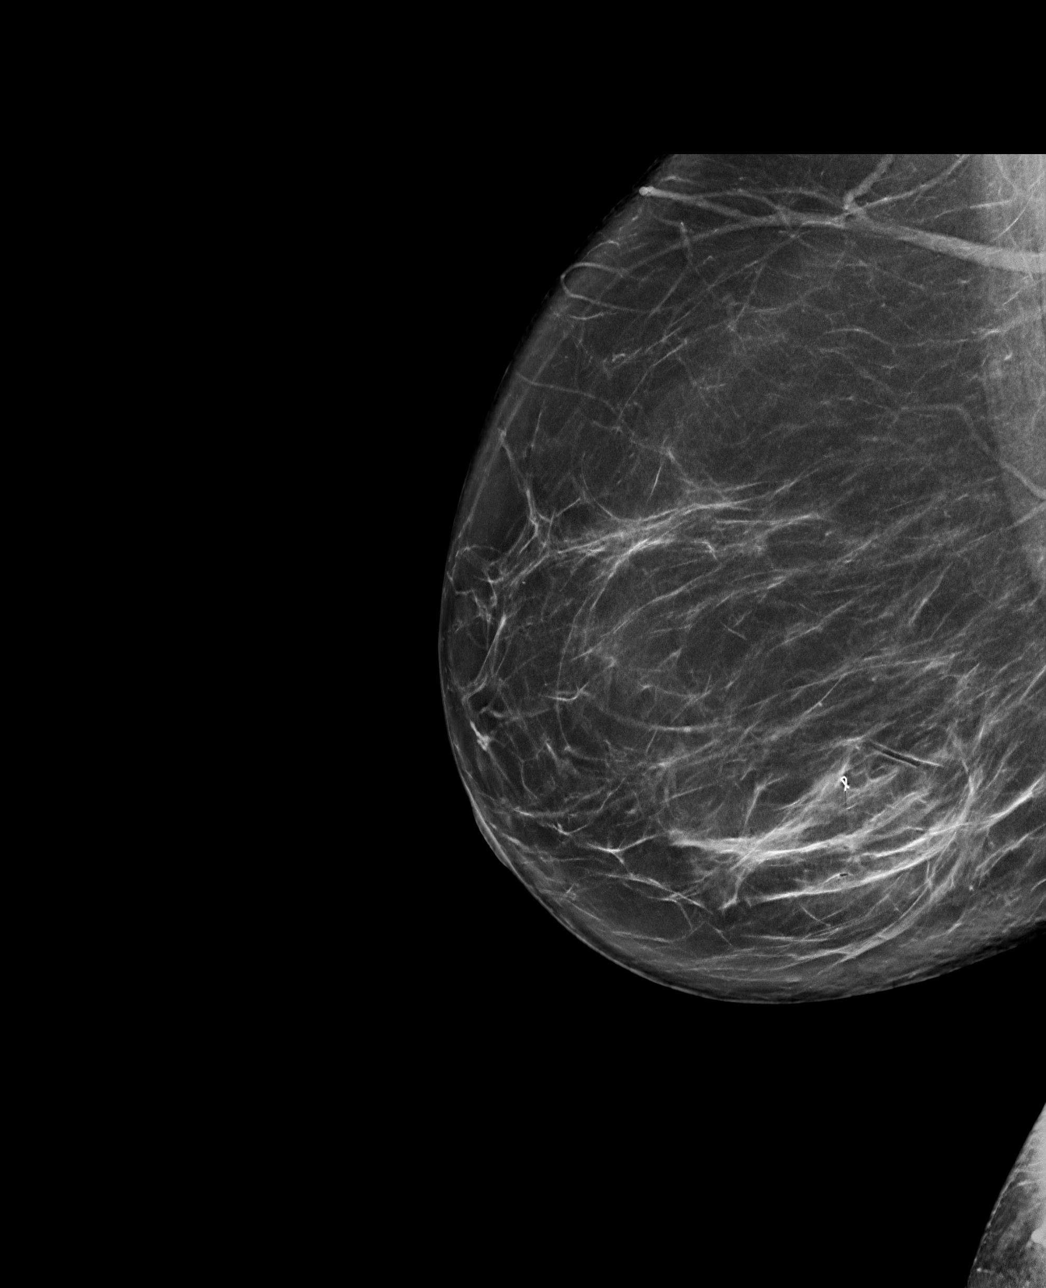

[R CC synth-2D]
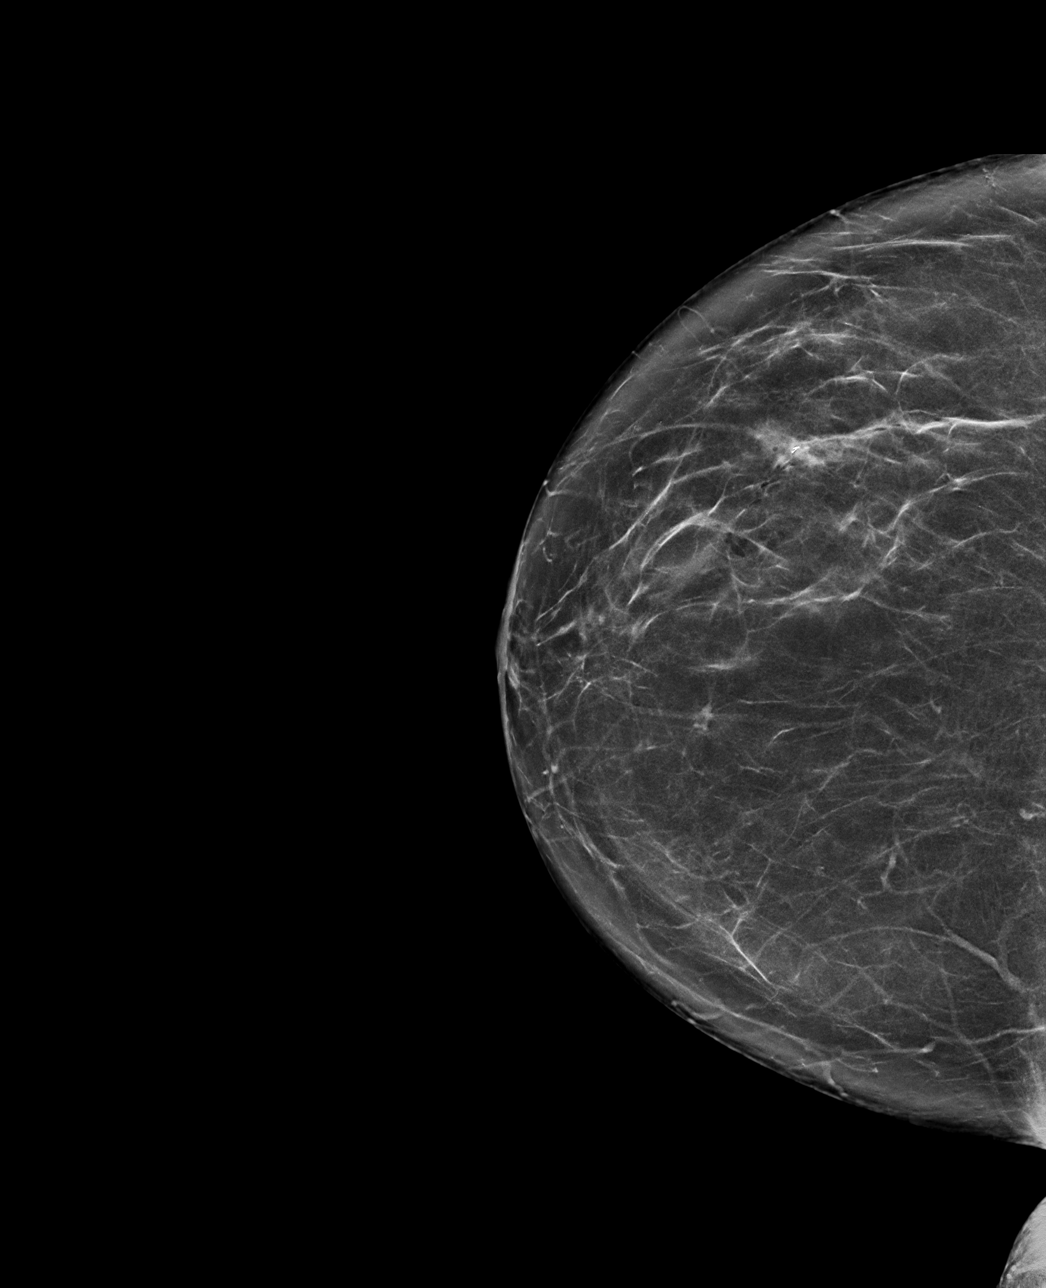

[R ML tomo · tomo slice 43/84.0]
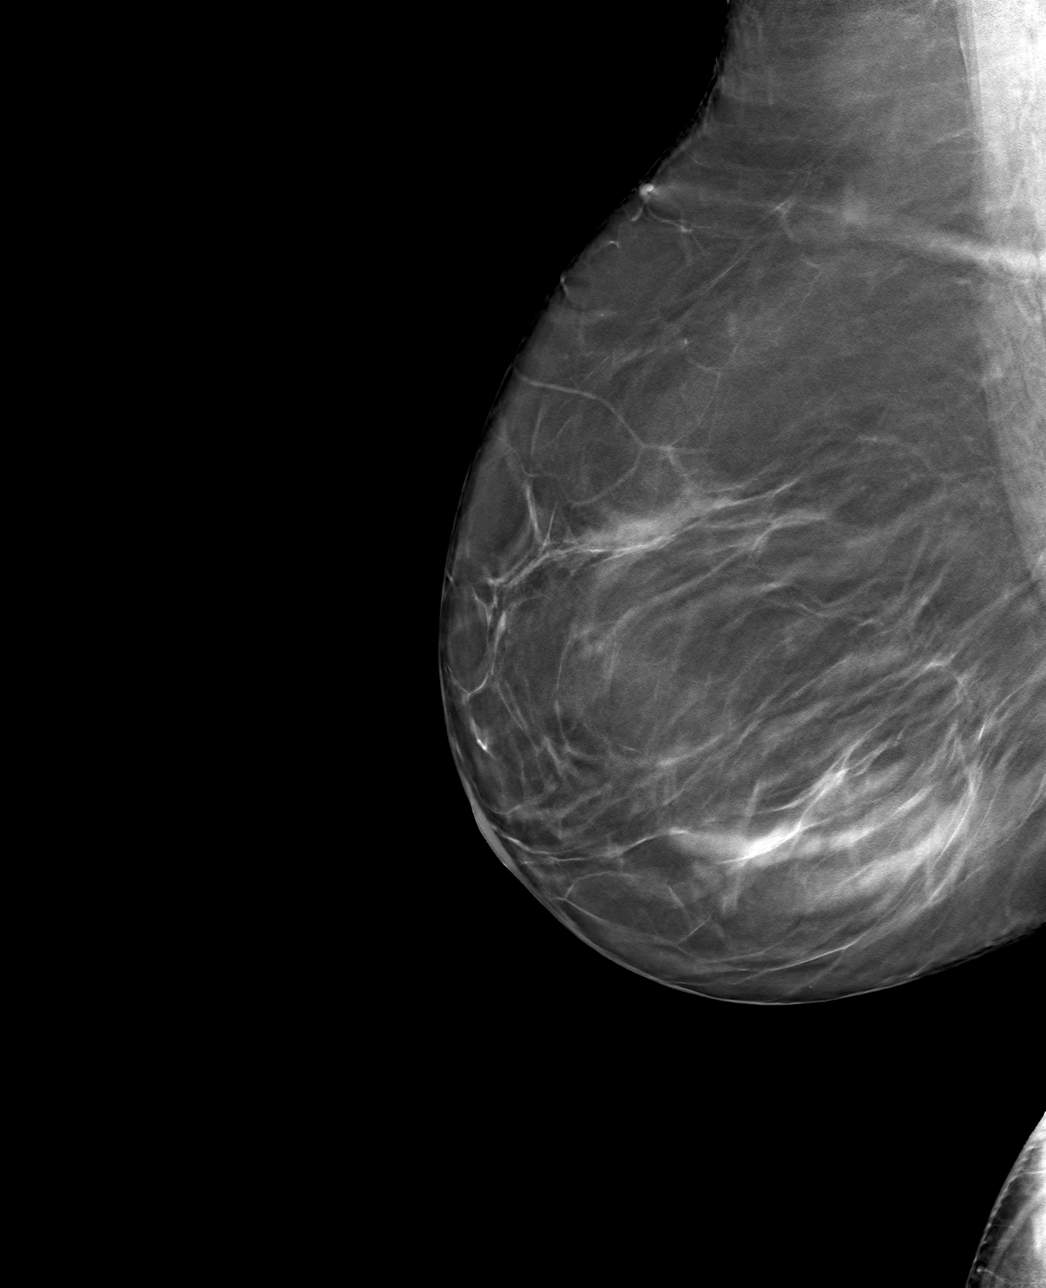

[R CC tomo · tomo slice 39/76.0]
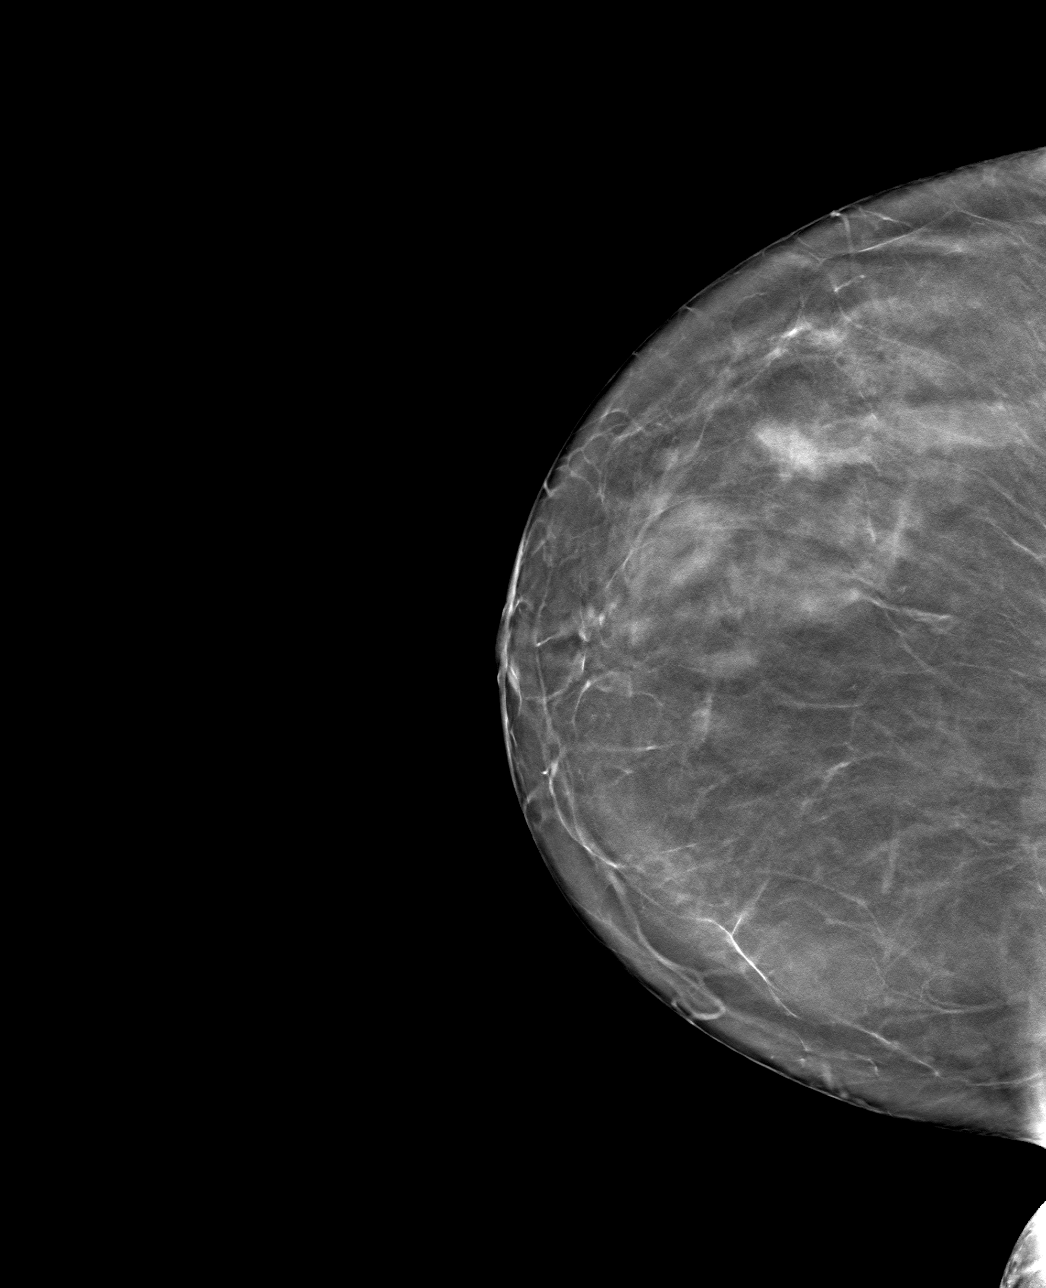

[4 of 12 positions shown; findings below may reference images not displayed]

FINDINGS: Mammographic images were obtained following ultrasound guided biopsy
of a right breast mass at 8 o'clock. The biopsy marking clip is in
expected position at the site of biopsy.
IMPRESSION: Appropriate positioning of the ribbon shaped biopsy marking clip at
the site of biopsy in the right breast at 8 o'clock.

Final Assessment: Post Procedure Mammograms for Marker Placement

## 2021-10-15 NOTE — Telephone Encounter (Signed)
RE: MRI results -she printed copy off for you to review ?Received: 6 days ago ?Kathy Kaplan, MD  Kathy Ponder, RN ?Called her today after reviewing all old and new scans.  She has had multiple pulmonary nodules mentioned in my original consult note and on CT from Sept 2021.  Scans since then have been at [facility] and radiologist didn't even mention on CT March 2022.  Description now sounds similar to previous and I rec continue to check annually.  The 2 splenic artery aneurysms are new to me yet calcified and not mentioned on MRI abd from Oct. 2021. I rec she include CT abdomen with annual CT chest to monitor these.   ?  ?   ? ? ? ? ? ? ?10/15/2021 Pt saw Dr Kathy Joseph this morning. He told her the MRI breast looks good. He asked her if anyone had mentioned the scattered solid micronodules in her lungs? Pt told him No. I did see within the paperwork she printed off that the Kathy Joseph, ANP @ [facility] charted that the mentioned areas possibly being from a choking event she had?!? She was going to send Dr Kathy Joseph for review. ?Pt also asked if the 2 15 mm  splenics artery aneurysms was something to be worried about?  Pt would just like your opinion, as she has much confidence in you.   Pt number # [phone]  I sent above message to Dr Kathy Joseph @ [phone] ? ?

## 2021-11-18 ENCOUNTER — Ambulatory Visit (INDEPENDENT_AMBULATORY_CARE_PROVIDER_SITE_OTHER): Payer: PPO | Admitting: Family Medicine

## 2021-11-18 ENCOUNTER — Telehealth: Payer: Self-pay

## 2021-11-18 ENCOUNTER — Encounter: Payer: Self-pay | Admitting: Family Medicine

## 2021-11-18 VITALS — BP 122/80 | HR 64 | Temp 98.4°F | Ht 67.0 in | Wt 241.0 lb

## 2021-11-18 DIAGNOSIS — E669 Obesity, unspecified: Secondary | ICD-10-CM

## 2021-11-18 DIAGNOSIS — E785 Hyperlipidemia, unspecified: Secondary | ICD-10-CM

## 2021-11-18 DIAGNOSIS — R739 Hyperglycemia, unspecified: Secondary | ICD-10-CM | POA: Diagnosis not present

## 2021-11-18 DIAGNOSIS — C50919 Malignant neoplasm of unspecified site of unspecified female breast: Secondary | ICD-10-CM

## 2021-11-18 DIAGNOSIS — I728 Aneurysm of other specified arteries: Secondary | ICD-10-CM

## 2021-11-18 LAB — COMPREHENSIVE METABOLIC PANEL
ALT: 17 U/L (ref 0–35)
AST: 21 U/L (ref 0–37)
Albumin: 4 g/dL (ref 3.5–5.2)
Alkaline Phosphatase: 85 U/L (ref 39–117)
BUN: 14 mg/dL (ref 6–23)
CO2: 29 mEq/L (ref 19–32)
Calcium: 9.2 mg/dL (ref 8.4–10.5)
Chloride: 105 mEq/L (ref 96–112)
Creatinine, Ser: 0.82 mg/dL (ref 0.40–1.20)
GFR: 73.7 mL/min (ref >60.00)
Glucose, Bld: 90 mg/dL (ref 70–99)
Potassium: 4 mEq/L (ref 3.5–5.1)
Sodium: 141 mEq/L (ref 135–145)
Total Bilirubin: 0.5 mg/dL (ref 0.2–1.2)
Total Protein: 6.8 g/dL (ref 6.0–8.3)

## 2021-11-18 LAB — CBC
HCT: 39.4 % (ref 36.0–46.0)
Hemoglobin: 13.3 g/dL (ref 12.0–15.0)
MCHC: 33.8 g/dL (ref 30.0–36.0)
MCV: 94.8 fl (ref 78.0–100.0)
Platelets: 182 10*3/uL (ref 150.0–400.0)
RBC: 4.16 Mil/uL (ref 3.87–5.11)
RDW: 12.1 % (ref 11.5–15.5)
WBC: 3.4 10*3/uL — ABNORMAL LOW (ref 4.0–10.5)

## 2021-11-18 LAB — HEMOGLOBIN A1C: Hgb A1c MFr Bld: 5.5 % (ref 4.6–6.5)

## 2021-11-18 LAB — LIPID PANEL
Cholesterol: 188 mg/dL (ref 0–200)
HDL: 70.4 mg/dL (ref >39.00)
LDL Cholesterol: 92 mg/dL (ref 0–99)
NonHDL: 117.64
Total CHOL/HDL Ratio: 3
Triglycerides: 129 mg/dL (ref 0.0–149.0)
VLDL: 25.8 mg/dL (ref 0.0–40.0)

## 2021-11-18 LAB — TSH: TSH: 1.86 u[IU]/mL (ref 0.35–5.50)

## 2021-11-18 NOTE — Assessment & Plan Note (Signed)
Check lipids 

## 2021-11-18 NOTE — Telephone Encounter (Signed)
Updated immunization record and responded to pt through MyChart message.  ?

## 2021-11-18 NOTE — Patient Instructions (Signed)
It was very nice to see you today! ? ?We will refer you to see a vascular surgeon. ? ?We will check blood work today.  We may try starting Ozempic depending on the results of your blood work or  refer you to see a bariatric specialist. ? ?Take care, ?Dr Jerline Pain ? ?PLEASE NOTE: ? ?If you had any lab tests please let us know if you have not heard back within a few days. You may see your results on mychart before we have a chance to review them but we will give you a call once they are reviewed by [name]. If we ordered any referrals today, please let us know if you have not heard from their office within the next week.  ? ?Please try these tips to maintain a healthy lifestyle: ? ?Eat at least 3 REAL meals and 1-2 snacks per day.  Aim for no more than 5 hours between eating.  If you eat breakfast, please do so within one hour of getting up.  ? ?Each meal should contain half fruits/vegetables, one quarter protein, and one quarter carbs (no bigger than a computer mouse) ? ?Cut down on sweet beverages. This includes juice, soda, and sweet tea.  ? ?Drink at least 1 glass of water with each meal and aim for at least 8 glasses per day ? ?Exercise at least 150 minutes every week.   ?

## 2021-11-18 NOTE — Assessment & Plan Note (Signed)
We tried prescribing Kathy Joseph a couple of years ago however insurance would not pay for.  We will check labs today including A1c.  May try Ozempic depending on results of her labs.  If insurance will not pay for GLP-1 agonist would consider referral to bariatric surgery per patient request. ?

## 2021-11-18 NOTE — Progress Notes (Signed)
? ?  Kathy Joseph is a 66 y.o. female who presents today for an office visit. ? ?Assessment/Plan:  ?Chronic Problems Addressed Today: ?Splenic artery aneurysm (HCC) ?Incidentally found on recent CT chest.  We will place referral to vascular surgery for further management.  ? ?Obesity ?We tried prescribing Mancel Parsons a couple of years ago however insurance would not pay for.  We will check labs today including A1c.  May try Ozempic depending on results of her labs.  If insurance will not pay for GLP-1 agonist would consider referral to bariatric surgery per patient request. ? ?Dyslipidemia ?Check lipids. ? 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 ? ? ?  ?Subjective:  ?HPI: ? ?Patient is here today for follow-up.  She is doing well.  She has not been seen for a couple of years.  She has been following up with [facility] oncology due to her history of angiosarcoma.  She had an MRI breast and CT chest done about a month ago as part of routine surveillance.  She was then incidentally found to have two 15 mm calcified splenic artery aneurysms.  This was not seen on previous CT scans.  She is not having any left upper quadrant pain.  No significant nausea or vomiting. ? ?   ?  ?Objective:  ?Physical Exam: ?BP 122/80 (BP Location: Left Arm)   Pulse 64   Temp 98.4 ?F (36.9 ?C) (Temporal)   Ht '5\' 7"'$  (1.702 m)   Wt 241 lb (109.3 kg)   SpO2 98%   BMI 37.75 kg/m?   ?Gen: No acute distress, resting comfortably ?CV: Regular rate and rhythm with no murmurs appreciated ?Pulm: Normal work of breathing, clear to auscultation bilaterally with no crackles, wheezes, or rhonchi ?Neuro: Grossly normal, moves all extremities ?Psych: Normal affect and thought content ? ?   ? ?Algis Greenhouse. Jerline Pain, MD ?11/18/2021 9:47 AM  ?

## 2021-11-18 NOTE — Telephone Encounter (Signed)
Patient states she received a pneumonia vac on 06/21/2020 at [facility] in [place] on [address]  Is requesting to update in chart.   Would like to know if she needs any other pneumonia vacs?

## 2021-11-18 NOTE — Assessment & Plan Note (Signed)
Continue management per oncology at [facility]. ?

## 2021-11-18 NOTE — Assessment & Plan Note (Signed)
Incidentally found on recent CT chest.  We will place referral to vascular surgery for further management.  ?

## 2021-11-20 ENCOUNTER — Encounter: Payer: Self-pay | Admitting: Surgery

## 2021-11-21 NOTE — Progress Notes (Signed)
Please inform patient of the following:  Labs are all stable.  Do not need to make any changes to treatment plan at this time.  She should continue to work on diet and exercise and we can recheck in a year.

## 2021-11-25 ENCOUNTER — Encounter: Payer: Self-pay | Admitting: Family Medicine

## 2021-11-25 NOTE — Telephone Encounter (Signed)
Please advise 

## 2021-11-26 ENCOUNTER — Other Ambulatory Visit: Payer: Self-pay

## 2021-11-26 DIAGNOSIS — E669 Obesity, unspecified: Secondary | ICD-10-CM

## 2021-11-26 NOTE — Telephone Encounter (Signed)
Ok to place referral to bariatric surgery. ? ?Kathy Joseph. Jerline Pain, MD ?11/26/2021 8:05 AM  ? ?

## 2021-11-26 NOTE — Telephone Encounter (Signed)
Referral placed.

## 2021-11-27 ENCOUNTER — Encounter: Payer: Self-pay | Admitting: Family Medicine

## 2021-12-31 DIAGNOSIS — M47817 Spondylosis without myelopathy or radiculopathy, lumbosacral region: Secondary | ICD-10-CM | POA: Diagnosis not present

## 2021-12-31 DIAGNOSIS — M4316 Spondylolisthesis, lumbar region: Secondary | ICD-10-CM | POA: Diagnosis not present

## 2021-12-31 DIAGNOSIS — M48061 Spinal stenosis, lumbar region without neurogenic claudication: Secondary | ICD-10-CM | POA: Diagnosis not present

## 2021-12-31 DIAGNOSIS — M5416 Radiculopathy, lumbar region: Secondary | ICD-10-CM | POA: Diagnosis not present

## 2021-12-31 DIAGNOSIS — M5136 Other intervertebral disc degeneration, lumbar region: Secondary | ICD-10-CM | POA: Diagnosis not present

## 2022-01-01 DIAGNOSIS — H524 Presbyopia: Secondary | ICD-10-CM | POA: Diagnosis not present

## 2022-01-01 DIAGNOSIS — H25813 Combined forms of age-related cataract, bilateral: Secondary | ICD-10-CM | POA: Diagnosis not present

## 2022-01-02 ENCOUNTER — Other Ambulatory Visit: Payer: Self-pay | Admitting: *Deleted

## 2022-01-02 DIAGNOSIS — I728 Aneurysm of other specified arteries: Secondary | ICD-10-CM

## 2022-01-07 DIAGNOSIS — M48061 Spinal stenosis, lumbar region without neurogenic claudication: Secondary | ICD-10-CM | POA: Diagnosis not present

## 2022-01-07 DIAGNOSIS — M47817 Spondylosis without myelopathy or radiculopathy, lumbosacral region: Secondary | ICD-10-CM | POA: Diagnosis not present

## 2022-01-07 DIAGNOSIS — M5136 Other intervertebral disc degeneration, lumbar region: Secondary | ICD-10-CM | POA: Diagnosis not present

## 2022-01-07 DIAGNOSIS — M5416 Radiculopathy, lumbar region: Secondary | ICD-10-CM | POA: Diagnosis not present

## 2022-01-07 DIAGNOSIS — M4316 Spondylolisthesis, lumbar region: Secondary | ICD-10-CM | POA: Diagnosis not present

## 2022-01-08 ENCOUNTER — Encounter: Payer: Self-pay | Admitting: Surgery

## 2022-01-08 ENCOUNTER — Ambulatory Visit (HOSPITAL_COMMUNITY)
Admission: RE | Admit: 2022-01-08 | Discharge: 2022-01-08 | Disposition: A | Discharge status: Home / Self Care | Payer: PPO | Source: Ambulatory Visit | Attending: Surgery | Admitting: Surgery

## 2022-01-08 ENCOUNTER — Ambulatory Visit: Payer: PPO | Admitting: Surgery

## 2022-01-08 VITALS — BP 121/73 | HR 65 | Temp 97.7°F | Resp 20 | Ht 67.0 in | Wt 237.6 lb

## 2022-01-08 DIAGNOSIS — I728 Aneurysm of other specified arteries: Secondary | ICD-10-CM

## 2022-01-08 NOTE — Progress Notes (Signed)
[facility]ist [facility]  Patient name: Kathy Joseph MRN: [id number] DOB: 1955-10-29 Sex: female   REQUESTING PROVIDER:    Dr. Jerline Pain   REASON FOR CONSULT:    Splenic aneurysm  HISTORY OF PRESENT ILLNESS:   Kathy Joseph is a 66 y.o. female, who is referred for evaluation of a splenic artery aneurysm.  This was detected on a follow-up scan that she had at [facility] for her breast cancer.  There were two 1.5 cm aneurysms in the splenic artery.  She denies any abdominal pain or back pain.  Patient has a history of melanoma in situ.  She also has varicose veins.  As mentioned above, she has been treated for angiosarcoma of the breast.  She has completed treatment.  PAST MEDICAL HISTORY    Past Medical History:  Diagnosis Date   Arthritis    Atypical nevus 06/11/2018   below left ear, moderate atypia   Atypical nevus 06/11/2018   right mid forehead, moderate to severe (wider shave done 07/08/2018)   Melanoma ([place]) 01/20/2019   melanoma in situ on right distal calf [place] on 07/29/202   Varicose veins of lower extremity      FAMILY HISTORY   Family History  Problem Relation Age of Onset   Breast cancer Mother    Colon cancer Father        Diagnosed in 73s.    Breast cancer Sister    Breast cancer Paternal Aunt    Breast cancer Maternal Aunt     SOCIAL HISTORY:   Social History   Socioeconomic History   Marital status: Married    Spouse name: Not on file   Number of children: Not on file   Years of education: Not on file   Highest education level: Not on file  Occupational History   Occupation: Retired  Tobacco Use   Smoking status: Never    Passive exposure: Never   Smokeless tobacco: Never  Vaping Use   Vaping Use: Never used  Substance and Sexual Activity   Alcohol use: Yes    Alcohol/week: 3.0 standard drinks of alcohol    Types: 3 Glasses of wine per week   Drug use: Never   Sexual activity: Yes     Partners: Male  Other Topics Concern   Not on file  Social History Narrative   Not on file   Social Determinants of Health   Financial Resource Strain: Low Risk  (04/01/2021)   Overall Financial Resource Strain (CARDIA)    Difficulty of Paying Living Expenses: Not hard at all  Food Insecurity: No Food Insecurity (04/01/2021)   Hunger Vital Sign    Worried About Running Out of Food in the Last Year: Never true    Ran Out of Food in the Last Year: Never true  Transportation Needs: No Transportation Needs (04/01/2021)   PRAPARE - Hydrologist (Medical): No    Lack of Transportation (Non-Medical): No  Physical Activity: Inactive (04/01/2021)   Exercise Vital Sign    Days of Exercise per Week: 0 days    Minutes of Exercise per Session: 0 min  Stress: No Stress Concern Present (04/01/2021)   [place]    Feeling of Stress : Not at all  Social Connections: [place] (04/01/2021)   Social Connection and Isolation Panel [NHANES]    Frequency of Communication with Friends and Family: More than three times a week  Frequency of Social Gatherings with Friends and Family: More than three times a week    Attends Religious Services: More than 4 times per year    Active Member of [employer] or Organizations: Yes    Attends Archivist Meetings: 1 to 4 times per year    Marital Status: Married  Human resources officer Violence: Not At Risk (04/01/2021)   Humiliation, Afraid, Rape, and Kick questionnaire    Fear of Current or Ex-Partner: No    Emotionally Abused: No    Physically Abused: No    Sexually Abused: No    ALLERGIES:    Allergies  Allergen Reactions   Cephalosporins Hives    CURRENT MEDICATIONS:    Current Outpatient Medications  Medication Sig Dispense Refill   Eflornithine HCl ([name]) 13.9 % cream Apply 1 application topically daily. 45 g 2   FLUZONE HIGH-DOSE  QUADRIVALENT 0.7 ML SUSY      Folic Acid-Vit W6-OMB T59 (HOMOCYSTEINE FORMULA) 0.8-50-0.1 MG TABS      Magnesium 250 MG TABS Take by mouth.     meloxicam (MOBIC) 15 MG tablet Take 15 mg by mouth daily.     Misc Natural Products (GLUCOSAMINE CHOND MSM FORMULA PO) Take by mouth.     multivitamin-lutein (OCUVITE-LUTEIN) CAPS capsule Take 1 capsule by mouth daily.     Probiotic Product (PROBIOTIC PEARLS ADVANTAGE PO) Take by mouth.     Cholecalciferol (VITAMIN D3) 10 MCG (400 UNIT) tablet Take 400 Units by mouth daily. (Patient not taking: Reported on 01/08/2022)     No current facility-administered medications for this visit.    REVIEW OF SYSTEMS:   '[X]'$  denotes positive finding, '[ ]'$  denotes negative finding Cardiac  Comments:  Chest pain or chest pressure:    Shortness of breath upon exertion:    Short of breath when lying flat:    Irregular heart rhythm:        Vascular    Pain in calf, thigh, or hip brought on by ambulation:    Pain in feet at night that wakes you up from your sleep:     Blood clot in your veins:    Leg swelling:         Pulmonary    Oxygen at home:    Productive cough:     Wheezing:         Neurologic    Sudden weakness in arms or legs:     Sudden numbness in arms or legs:     Sudden onset of difficulty speaking or slurred speech:    Temporary loss of vision in one eye:     Problems with dizziness:         Gastrointestinal    Blood in stool:      Vomited blood:         Genitourinary    Burning when urinating:     Blood in urine:        Psychiatric    Major depression:         Hematologic    Bleeding problems:    Problems with blood clotting too easily:        Skin    Rashes or ulcers:        Constitutional    Fever or chills:     PHYSICAL EXAM:   Vitals:   01/08/22 1020  BP: 121/73  Pulse: 65  Resp: 20  Temp: 97.7 F (36.5 C)  TempSrc: Temporal  SpO2: 98%  Weight: 237 lb  9.6 oz (107.8 kg)  Height: '5\' 7"'$  (1.702 m)    GENERAL:  The patient is a well-nourished female, in no acute distress. The vital signs are documented above. CARDIAC: There is a regular rate and rhythm.  PULMONARY: Nonlabored respirations ABDOMEN: Soft and non-tender with no palpable masses MUSCULOSKELETAL: There are no major deformities or cyanosis. NEUROLOGIC: No focal weakness or paresthesias are detected. SKIN: There are no ulcers or rashes noted. PSYCHIATRIC: The patient has a normal affect.  STUDIES:   I reviewed her ultrasound following findings: Mesenteric:     Limited visualization in the area of the proximal aorta due to bowel gas.  Unable  to visualize splenic artery aneurysm - Largest diameter measurement 1.20  cm.  Celiac artery was not visualized.   ASSESSMENT and PLAN   Splenic artery aneurysm: Ultrasound today shows a 1.2 cm aneurysm but there was limited visibility.  Her CT scan shows two 1.5 cm aneurysms on the splenic artery.  I discussed with the patient that she is below the threshold of having this treated.  These have most likely been present for a long time.  I will need to follow them in the future to monitor for growth.  She is going to coordinate her visit with me in 1 year to follow her CT scan that she has at [facility].  She will bring me a copy of the CT scan when she returns.   Leia Alf, MD, FACS [facility] Specialists of [facility] [phone] Pager [phone]

## 2022-01-09 DIAGNOSIS — M4316 Spondylolisthesis, lumbar region: Secondary | ICD-10-CM | POA: Diagnosis not present

## 2022-01-09 DIAGNOSIS — M5136 Other intervertebral disc degeneration, lumbar region: Secondary | ICD-10-CM | POA: Diagnosis not present

## 2022-01-09 DIAGNOSIS — M48061 Spinal stenosis, lumbar region without neurogenic claudication: Secondary | ICD-10-CM | POA: Diagnosis not present

## 2022-01-09 DIAGNOSIS — M5416 Radiculopathy, lumbar region: Secondary | ICD-10-CM | POA: Diagnosis not present

## 2022-01-09 DIAGNOSIS — M47817 Spondylosis without myelopathy or radiculopathy, lumbosacral region: Secondary | ICD-10-CM | POA: Diagnosis not present

## 2022-01-10 ENCOUNTER — Other Ambulatory Visit: Payer: Self-pay | Admitting: *Deleted

## 2022-01-13 DIAGNOSIS — M47817 Spondylosis without myelopathy or radiculopathy, lumbosacral region: Secondary | ICD-10-CM | POA: Diagnosis not present

## 2022-01-13 DIAGNOSIS — M4316 Spondylolisthesis, lumbar region: Secondary | ICD-10-CM | POA: Diagnosis not present

## 2022-01-13 DIAGNOSIS — M5136 Other intervertebral disc degeneration, lumbar region: Secondary | ICD-10-CM | POA: Diagnosis not present

## 2022-01-13 DIAGNOSIS — M5416 Radiculopathy, lumbar region: Secondary | ICD-10-CM | POA: Diagnosis not present

## 2022-01-13 DIAGNOSIS — M48061 Spinal stenosis, lumbar region without neurogenic claudication: Secondary | ICD-10-CM | POA: Diagnosis not present

## 2022-01-20 DIAGNOSIS — M5416 Radiculopathy, lumbar region: Secondary | ICD-10-CM | POA: Diagnosis not present

## 2022-01-20 DIAGNOSIS — M5136 Other intervertebral disc degeneration, lumbar region: Secondary | ICD-10-CM | POA: Diagnosis not present

## 2022-01-20 DIAGNOSIS — M48061 Spinal stenosis, lumbar region without neurogenic claudication: Secondary | ICD-10-CM | POA: Diagnosis not present

## 2022-01-20 DIAGNOSIS — M47817 Spondylosis without myelopathy or radiculopathy, lumbosacral region: Secondary | ICD-10-CM | POA: Diagnosis not present

## 2022-01-20 DIAGNOSIS — M4316 Spondylolisthesis, lumbar region: Secondary | ICD-10-CM | POA: Diagnosis not present

## 2022-01-23 ENCOUNTER — Encounter: Payer: Self-pay | Admitting: Family Medicine

## 2022-01-23 DIAGNOSIS — M47817 Spondylosis without myelopathy or radiculopathy, lumbosacral region: Secondary | ICD-10-CM | POA: Diagnosis not present

## 2022-01-23 DIAGNOSIS — M5136 Other intervertebral disc degeneration, lumbar region: Secondary | ICD-10-CM | POA: Diagnosis not present

## 2022-01-23 DIAGNOSIS — M48061 Spinal stenosis, lumbar region without neurogenic claudication: Secondary | ICD-10-CM | POA: Diagnosis not present

## 2022-01-23 DIAGNOSIS — M5416 Radiculopathy, lumbar region: Secondary | ICD-10-CM | POA: Diagnosis not present

## 2022-01-23 DIAGNOSIS — M4316 Spondylolisthesis, lumbar region: Secondary | ICD-10-CM | POA: Diagnosis not present

## 2022-01-23 NOTE — Telephone Encounter (Signed)
Please advise 

## 2022-01-24 NOTE — Telephone Encounter (Signed)
It is normal for WBC to fluctuate slightly and her levels were not low enough to be of any significant concern. There isn't anything she can do to make them higher but she can come back in for repeat blood work to make sure they are stable if she wishes. Ok to place order for CBC with differential.   Kathy Joseph. Jerline Pain, MD 01/24/2022 8:30 AM

## 2022-01-28 ENCOUNTER — Other Ambulatory Visit: Payer: Self-pay | Admitting: *Deleted

## 2022-01-28 DIAGNOSIS — M4316 Spondylolisthesis, lumbar region: Secondary | ICD-10-CM | POA: Diagnosis not present

## 2022-01-28 DIAGNOSIS — M48061 Spinal stenosis, lumbar region without neurogenic claudication: Secondary | ICD-10-CM | POA: Diagnosis not present

## 2022-01-28 DIAGNOSIS — D729 Disorder of white blood cells, unspecified: Secondary | ICD-10-CM

## 2022-01-28 DIAGNOSIS — M47817 Spondylosis without myelopathy or radiculopathy, lumbosacral region: Secondary | ICD-10-CM | POA: Diagnosis not present

## 2022-01-28 DIAGNOSIS — M5416 Radiculopathy, lumbar region: Secondary | ICD-10-CM | POA: Diagnosis not present

## 2022-01-28 DIAGNOSIS — M5136 Other intervertebral disc degeneration, lumbar region: Secondary | ICD-10-CM | POA: Diagnosis not present

## 2022-01-31 DIAGNOSIS — M5416 Radiculopathy, lumbar region: Secondary | ICD-10-CM | POA: Diagnosis not present

## 2022-01-31 DIAGNOSIS — M47817 Spondylosis without myelopathy or radiculopathy, lumbosacral region: Secondary | ICD-10-CM | POA: Diagnosis not present

## 2022-01-31 DIAGNOSIS — M48061 Spinal stenosis, lumbar region without neurogenic claudication: Secondary | ICD-10-CM | POA: Diagnosis not present

## 2022-01-31 DIAGNOSIS — M5136 Other intervertebral disc degeneration, lumbar region: Secondary | ICD-10-CM | POA: Diagnosis not present

## 2022-01-31 DIAGNOSIS — M4316 Spondylolisthesis, lumbar region: Secondary | ICD-10-CM | POA: Diagnosis not present

## 2022-02-04 DIAGNOSIS — M5136 Other intervertebral disc degeneration, lumbar region: Secondary | ICD-10-CM | POA: Diagnosis not present

## 2022-02-04 DIAGNOSIS — M47817 Spondylosis without myelopathy or radiculopathy, lumbosacral region: Secondary | ICD-10-CM | POA: Diagnosis not present

## 2022-02-04 DIAGNOSIS — M5416 Radiculopathy, lumbar region: Secondary | ICD-10-CM | POA: Diagnosis not present

## 2022-02-04 DIAGNOSIS — M48061 Spinal stenosis, lumbar region without neurogenic claudication: Secondary | ICD-10-CM | POA: Diagnosis not present

## 2022-02-04 DIAGNOSIS — M4316 Spondylolisthesis, lumbar region: Secondary | ICD-10-CM | POA: Diagnosis not present

## 2022-02-07 DIAGNOSIS — M5416 Radiculopathy, lumbar region: Secondary | ICD-10-CM | POA: Diagnosis not present

## 2022-02-07 DIAGNOSIS — M48061 Spinal stenosis, lumbar region without neurogenic claudication: Secondary | ICD-10-CM | POA: Diagnosis not present

## 2022-02-07 DIAGNOSIS — M4316 Spondylolisthesis, lumbar region: Secondary | ICD-10-CM | POA: Diagnosis not present

## 2022-02-07 DIAGNOSIS — M5136 Other intervertebral disc degeneration, lumbar region: Secondary | ICD-10-CM | POA: Diagnosis not present

## 2022-02-07 DIAGNOSIS — M47817 Spondylosis without myelopathy or radiculopathy, lumbosacral region: Secondary | ICD-10-CM | POA: Diagnosis not present

## 2022-02-10 DIAGNOSIS — M5136 Other intervertebral disc degeneration, lumbar region: Secondary | ICD-10-CM | POA: Diagnosis not present

## 2022-02-10 DIAGNOSIS — M47817 Spondylosis without myelopathy or radiculopathy, lumbosacral region: Secondary | ICD-10-CM | POA: Diagnosis not present

## 2022-02-10 DIAGNOSIS — M4316 Spondylolisthesis, lumbar region: Secondary | ICD-10-CM | POA: Diagnosis not present

## 2022-02-10 DIAGNOSIS — M5416 Radiculopathy, lumbar region: Secondary | ICD-10-CM | POA: Diagnosis not present

## 2022-02-10 DIAGNOSIS — M48061 Spinal stenosis, lumbar region without neurogenic claudication: Secondary | ICD-10-CM | POA: Diagnosis not present

## 2022-02-12 DIAGNOSIS — M48061 Spinal stenosis, lumbar region without neurogenic claudication: Secondary | ICD-10-CM | POA: Diagnosis not present

## 2022-02-12 DIAGNOSIS — M1711 Unilateral primary osteoarthritis, right knee: Secondary | ICD-10-CM | POA: Diagnosis not present

## 2022-02-12 DIAGNOSIS — M5136 Other intervertebral disc degeneration, lumbar region: Secondary | ICD-10-CM | POA: Diagnosis not present

## 2022-02-12 DIAGNOSIS — M47817 Spondylosis without myelopathy or radiculopathy, lumbosacral region: Secondary | ICD-10-CM | POA: Diagnosis not present

## 2022-02-12 DIAGNOSIS — M5416 Radiculopathy, lumbar region: Secondary | ICD-10-CM | POA: Diagnosis not present

## 2022-02-12 DIAGNOSIS — M4316 Spondylolisthesis, lumbar region: Secondary | ICD-10-CM | POA: Diagnosis not present

## 2022-02-18 DIAGNOSIS — M48061 Spinal stenosis, lumbar region without neurogenic claudication: Secondary | ICD-10-CM | POA: Diagnosis not present

## 2022-02-18 DIAGNOSIS — M5416 Radiculopathy, lumbar region: Secondary | ICD-10-CM | POA: Diagnosis not present

## 2022-02-18 DIAGNOSIS — M47817 Spondylosis without myelopathy or radiculopathy, lumbosacral region: Secondary | ICD-10-CM | POA: Diagnosis not present

## 2022-02-18 DIAGNOSIS — M5136 Other intervertebral disc degeneration, lumbar region: Secondary | ICD-10-CM | POA: Diagnosis not present

## 2022-02-18 DIAGNOSIS — M4316 Spondylolisthesis, lumbar region: Secondary | ICD-10-CM | POA: Diagnosis not present

## 2022-02-20 DIAGNOSIS — M5416 Radiculopathy, lumbar region: Secondary | ICD-10-CM | POA: Diagnosis not present

## 2022-02-20 DIAGNOSIS — M5136 Other intervertebral disc degeneration, lumbar region: Secondary | ICD-10-CM | POA: Diagnosis not present

## 2022-02-20 DIAGNOSIS — M48061 Spinal stenosis, lumbar region without neurogenic claudication: Secondary | ICD-10-CM | POA: Diagnosis not present

## 2022-02-20 DIAGNOSIS — M47817 Spondylosis without myelopathy or radiculopathy, lumbosacral region: Secondary | ICD-10-CM | POA: Diagnosis not present

## 2022-02-20 DIAGNOSIS — M4316 Spondylolisthesis, lumbar region: Secondary | ICD-10-CM | POA: Diagnosis not present

## 2022-02-23 NOTE — Progress Notes (Signed)
Kathy Joseph  [address] [place],  [place]  [address] [phone]  Clinic Day:  02/24/22  Referring physician: Vivi Barrack, MD   CHIEF COMPLAINT:  CC: History of mammary angiosarcoma   Current Treatment:  Surveillance   HISTORY OF PRESENT ILLNESS:  Kathy Joseph is a 66 y.o. female with mammary angiosarcoma of the right breast, diagnosed in August 2021.  This was found on a screening mammogram in November 2020, where she was found to have possible asymmetry in the right breast.  Diagnostic right mammogram and ultrasound revealed the asymmetry to persist on additional views, but ultrasound did not reveal any suspicious masses.  There were areas of focal fibroglandular tissue that may correspond to the abnormality.  Six-month follow-up right diagnostic mammogram and ultrasound were recommended.  In May 2021, there was an increase in the focal asymmetry in the right outer breast.  Ultrasound revealed an irregular mass at 8 o'clock 6 cm from the nipple measuring 1.9 x 1.7 x 1.1 cm.  No enlarged axillary lymph nodes were seen.  Biopsy revealed an angiolipoma with a differential diagnosis of fat necrosis versus malignancy.  The radiologist felt the findings were concordant, but as he was still concerned about the appearance on ultrasound, excision was recommended, so she was referred to Dr. Lilia Pro.  She underwent needle localized lumpectomy on August 21st and surgical pathology from this procedure revealed mammary angiosarcoma.  These results were sent to [facility], and confirmed.  Since even low grade lesions of this type have a substantial risk of distant metastasis, tumors of this type are generally treated by mastectomy.  CT chest from September 1st revealed no evidence of metastatic disease in the chest.  Bilateral calcified pulmonary nodules are consistent with prior granulomatous disease.  There is a 9 mm subpleural nodule in the left upper lobe with  curvilinear calcifications.  Regardless this appears benign.  Multiple fat density lesions involving both kidneys are consistent with multiple angiomyolipomas.  There is also an indeterminate 15 mm lesion arising from the posterior right kidney, which may represent a lipid poor angiomyolipoma.  Further characterization with renal protocol MRI was recommended.  Hypodense lesion in the left lobe of the thyroid gland measuring 12 mm.  Given size, this is not clinically significant.  She has been seen at [facility] in consultation and will be returning for their recommendations.  She started menarche at age 23.  She has had 4 pregnancies, with 3 live births.  Her 1st live birth was at age 61.  She was placed on tamoxifen as chemoprevention for 2-3 years, but this was discontinued after she developed a superficial venous thrombosis of the left leg.  She took oral contraceptives for 9 years after her last child.  The professors at [facility] reviewed the surgical pathology from her procedure, and they did not recommend any further excision at this time as her margins were clear.  The closest margin was 1.5 mm.  They did recommend close follow up with breast MRI and CT imaging every 6 months for the next 2 years. CT and MRI imaging from March 2022 at [facility] are benign.   INTERVAL HISTORY:  Kathy Joseph is here for routine follow up and states that she is doing well and denies complaints today. Bilateral mammogram will be done at [facility] in September. She continues close follow up at [facility] and they plan CT and MRI imaging in March. She was found to have a splenic aneurysm. She  also continues follow up with Dr. Lilia Pro.She had a herniated disk 18 years ago and now has again. She is undergoing PT twice weekly. She has also had a knee injection.  She undergoes routine lab work with her PCP.Her WBC was low at 3.4 but has come up today to 5.6 with normal differential. The rest of her CBC and CMP is also normal. Her  appetite is good, and  she has lost 2 pounds since her last visit.  She denies fever, chills or other signs of infection.  She denies nausea, vomiting, bowel issues, or abdominal pain.  She denies sore throat, cough, dyspnea, or chest pain.  Review of Systems  Constitutional: Negative.  Negative for appetite change, chills, fatigue, fever and unexpected weight change.  HENT:  Negative.    Eyes: Negative.   Respiratory: Negative.  Negative for chest tightness, cough, hemoptysis, shortness of breath and wheezing.   Cardiovascular: Negative.  Negative for chest pain, leg swelling and palpitations.  Gastrointestinal: Negative.  Negative for abdominal distention, abdominal pain, blood in stool, constipation, diarrhea, nausea and vomiting.  Endocrine: Negative.   Genitourinary: Negative.  Negative for difficulty urinating, dysuria, frequency and hematuria.   Musculoskeletal: Negative.  Negative for arthralgias, back pain, flank pain, gait problem and myalgias.  Skin: Negative.   Neurological: Negative.  Negative for dizziness, extremity weakness, gait problem, headaches, light-headedness, numbness, seizures and speech difficulty.  Hematological: Negative.   Psychiatric/Behavioral: Negative.  Negative for depression and sleep disturbance. The patient is not nervous/anxious.      VITALS:  Blood pressure (!) 146/70, pulse 64, temperature (!) 97.5 F (36.4 C), temperature source Oral, resp. rate 18, height '5\' 7"'$  (1.702 m), weight 235 lb 4.8 oz (106.7 kg), SpO2 96 %.  Wt Readings from Last 3 Encounters:  02/24/22 235 lb 4.8 oz (106.7 kg)  01/08/22 237 lb 9.6 oz (107.8 kg)  11/18/21 241 lb (109.3 kg)    Body mass index is 36.85 kg/m.  Performance status (ECOG): 0 - Asymptomatic  PHYSICAL EXAM:  Physical Exam Constitutional:      General: She is not in acute distress.    Appearance: Normal appearance. She is normal weight.  HENT:     Head: Normocephalic and atraumatic.  Eyes:     General: No scleral icterus.     Extraocular Movements: Extraocular movements intact.     Conjunctiva/sclera: Conjunctivae normal.     Pupils: Pupils are equal, round, and reactive to light.  Cardiovascular:     Rate and Rhythm: Normal rate and regular rhythm.     Pulses: Normal pulses.     Heart sounds: Normal heart sounds. No murmur heard.    No friction rub. No gallop.  Pulmonary:     Effort: Pulmonary effort is normal. No respiratory distress.     Breath sounds: Normal breath sounds.  Chest:     Comments: Faint nodularity of the scar in the lower outer quadrant of the right breast but no masses in either breast. I could barely see her scar. Abdominal:     General: Bowel sounds are normal. There is no distension.     Palpations: Abdomen is soft. There is no hepatomegaly, splenomegaly or mass.     Tenderness: There is no abdominal tenderness.  Musculoskeletal:        General: Normal range of motion.     Cervical back: Normal range of motion and neck supple.     Right lower leg: Right lower leg edema: mild.  Left lower leg: Left lower leg edema: mild.  Lymphadenopathy:     Cervical: No cervical adenopathy.  Skin:    General: Skin is warm and dry.     Comments: Several small lipomas of the right upper extremity.  Neurological:     General: No focal deficit present.     Mental Status: She is alert and oriented to person, place, and time. Mental status is at baseline.  Psychiatric:        Mood and Affect: Mood normal.        Behavior: Behavior normal.        Thought Content: Thought content normal.        Judgment: Judgment normal.    LABS:      Latest Ref Rng & Units 02/24/2022   12:00 AM 11/18/2021    9:42 AM 01/29/2021   12:00 AM  CBC  WBC  5.6     3.4  3.6      Hemoglobin 12.0 - 16.0 14.0     13.3  14.3      Hematocrit 36 - 46 42     39.4  40      Platelets 150 - 400 K/uL 174     182.0  184         This result is from an external source.      Latest Ref Rng & Units 02/24/2022   12:00 AM 11/18/2021     9:42 AM 01/29/2021   12:00 AM  CMP  Glucose 70 - 99 mg/dL  90    BUN 4 - '21 17     14  16      '$ Creatinine 0.5 - 1.1 0.7     0.82  0.7      Sodium 137 - 147 136     141  136      Potassium 3.5 - 5.1 mEq/L 4.3     4.0  4.5      Chloride 99 - 108 104     105  104      CO2 13 - '22 27     29  22      '$ Calcium 8.7 - 10.7 8.9     9.2  9.1      Total Protein 6.0 - 8.3 g/dL  6.8    Total Bilirubin 0.2 - 1.2 mg/dL  0.5    Alkaline Phos 25 - 125 87     85  91      AST 13 - 35 '21     21  31      '$ ALT 7 - 35 U/L '19     17  25         '$ This result is from an external source.    STUDIES:   EXAM: MAMMO DIGITAL DIAGNOSTIC BILATERAL  DATE: 04/04/2021 1:12 PM  ACCESSION: [id number]  DICTATED: 04/04/2021 1:39 PM  INTERPRETATION LOCATION: [place]   CLINICAL INDICATION: 66 years old Female with PERSONAL HX MALIG NEOPLASM BREAST  - C49.9 - Angiosarcoma (CMS - [facility]) - C50.521 - Malignant neoplasm of lower - outer quadrant of right breast, unspecified estrogen receptor status (CMS - [place]).  History of excision of right breast angiosarcoma 02/2020.   COMPARISON: Outside mammograms 2021, 2020   TECHNIQUE: Full-field digital diagnostic and spot 2D/3D mammogram.   Tomosynthesis imaging was used to improve the sensitivity and specificity of the detection of breast cancer.   BREAST DENSITY: a - The breasts are almost  entirely fatty.   FINDINGS:    There are postsurgical changes in the right breast.  There is no suspicious mass, calcifications, or other abnormality in either breast.   IMPRESSION:  No mammographic evidence of malignancy.   Allergies:  Allergies  Allergen Reactions   Cephalosporins Hives    Current Medications: Current Outpatient Medications  Medication Sig Dispense Refill   Cholecalciferol (VITAMIN D3) 10 MCG (400 UNIT) tablet Take 400 Units by mouth daily. (Patient not taking: Reported on 01/08/2022)     cyanocobalamin (VITAMIN B12) 500 MCG tablet Take 500 mcg by mouth daily.      FLUZONE HIGH-DOSE QUADRIVALENT 0.7 ML SUSY      Folic Acid-Vit K5-LDJ T70 (HOMOCYSTEINE FORMULA) 0.8-50-0.1 MG TABS      Magnesium 250 MG TABS Take by mouth.     meloxicam (MOBIC) 15 MG tablet Take 15 mg by mouth daily.     Misc Natural Products (GLUCOSAMINE CHOND MSM FORMULA PO) Take by mouth.     multivitamin-lutein (OCUVITE-LUTEIN) CAPS capsule Take 1 capsule by mouth daily.     Probiotic Product (PROBIOTIC PEARLS ADVANTAGE PO) Take by mouth.     No current facility-administered medications for this visit.     ASSESSMENT & PLAN:   Assessment:   1.  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  She has undergone lumpectomy with clear margins.  [facility] has not recommended further surgical excision.  She will be undergoing MRI breast and CT imaging yearly now for close follow up. This is scheduled for March. Her annual mammograms are done in September with Dr. Lilia Pro.  2.  Strong family history of breast cancer. [facility] Multi Cancer Panel test was negative-no clinically significant mutation identified.  There was a variant of uncertain significance (VUS) found in the [facility] gene.   3.  History of melanoma in situ of the lower right calf, diagnosed by Dr. Marge Duncans, dermatology in [place].  This was treated with wide excision.  She follows up with dermatology routinely.     4.  Multiple angiomyolipomas of bilateral kidneys on CT scan.  There is also an indeterminate 15 mm lesion arising from the posterior right kidney.  This may represent a lipid poor angiomyolipoma, but she is followed regularly.  5.  Multiple benign lipomas of the skin, mainly involving the right upper extremity.  6.  Colon polyps and familial history of colon cancer.  She should have a repeat colonoscopy in the next 1-2 years.    Plan: She is scheduled for CT and MRI imaging in March at [facility]. She continues to follow closely every 3 months between here, [facility] and Dr. Pietro Cassis office, who  schedules her annual mammography in September. We will see her back in 6 months with repeat CBC, CMP and evaluation. She verbalizes understanding of and agreement to the plans discussed today. She knows to call the office should any new questions or concerns arise.   I provided 15 minutes of face-to-face time during this this encounter and > 50% was spent counseling as documented under my assessment and plan.    Derwood Kaplan, MD [facility] [address] [place] [address] Dept: [phone] Dept Fax: [phone]

## 2022-02-24 ENCOUNTER — Inpatient Hospital Stay: Payer: PPO | Attending: Oncology | Admitting: Oncology

## 2022-02-24 ENCOUNTER — Other Ambulatory Visit: Payer: Self-pay

## 2022-02-24 ENCOUNTER — Other Ambulatory Visit: Payer: Self-pay | Admitting: Oncology

## 2022-02-24 ENCOUNTER — Inpatient Hospital Stay: Payer: PPO

## 2022-02-24 ENCOUNTER — Encounter: Payer: Self-pay | Admitting: Oncology

## 2022-02-24 VITALS — BP 146/70 | HR 64 | Temp 97.5°F | Resp 18 | Ht 67.0 in | Wt 235.3 lb

## 2022-02-24 DIAGNOSIS — M48061 Spinal stenosis, lumbar region without neurogenic claudication: Secondary | ICD-10-CM | POA: Diagnosis not present

## 2022-02-24 DIAGNOSIS — C50919 Malignant neoplasm of unspecified site of unspecified female breast: Secondary | ICD-10-CM

## 2022-02-24 DIAGNOSIS — L820 Inflamed seborrheic keratosis: Secondary | ICD-10-CM | POA: Diagnosis not present

## 2022-02-24 DIAGNOSIS — D0371 Melanoma in situ of right lower limb, including hip: Secondary | ICD-10-CM

## 2022-02-24 DIAGNOSIS — D72819 Decreased white blood cell count, unspecified: Secondary | ICD-10-CM

## 2022-02-24 DIAGNOSIS — M4316 Spondylolisthesis, lumbar region: Secondary | ICD-10-CM | POA: Diagnosis not present

## 2022-02-24 DIAGNOSIS — D729 Disorder of white blood cells, unspecified: Secondary | ICD-10-CM | POA: Diagnosis not present

## 2022-02-24 DIAGNOSIS — D485 Neoplasm of uncertain behavior of skin: Secondary | ICD-10-CM | POA: Diagnosis not present

## 2022-02-24 DIAGNOSIS — M5416 Radiculopathy, lumbar region: Secondary | ICD-10-CM | POA: Diagnosis not present

## 2022-02-24 DIAGNOSIS — C499 Malignant neoplasm of connective and soft tissue, unspecified: Secondary | ICD-10-CM | POA: Insufficient documentation

## 2022-02-24 DIAGNOSIS — M5136 Other intervertebral disc degeneration, lumbar region: Secondary | ICD-10-CM | POA: Diagnosis not present

## 2022-02-24 DIAGNOSIS — M47817 Spondylosis without myelopathy or radiculopathy, lumbosacral region: Secondary | ICD-10-CM | POA: Diagnosis not present

## 2022-02-24 DIAGNOSIS — Z86718 Personal history of other venous thrombosis and embolism: Secondary | ICD-10-CM | POA: Insufficient documentation

## 2022-02-24 DIAGNOSIS — Z85820 Personal history of malignant melanoma of skin: Secondary | ICD-10-CM | POA: Insufficient documentation

## 2022-02-24 LAB — HEPATIC FUNCTION PANEL
ALT: 19 U/L (ref 7–35)
AST: 21 (ref 13–35)
Alkaline Phosphatase: 87 (ref 25–125)
Bilirubin, Total: 0.6

## 2022-02-24 LAB — CBC AND DIFFERENTIAL
HCT: 42 (ref 36–46)
Hemoglobin: 14 (ref 12.0–16.0)
Neutrophils Absolute: 3.53
Platelets: 174 10*3/uL (ref 150–400)
WBC: 5.6

## 2022-02-24 LAB — BASIC METABOLIC PANEL
BUN: 17 (ref 4–21)
CO2: 27 — AB (ref 13–22)
Chloride: 104 (ref 99–108)
Creatinine: 0.7 (ref 0.5–1.1)
Glucose: 94
Potassium: 4.3 mEq/L (ref 3.5–5.1)
Sodium: 136 — AB (ref 137–147)

## 2022-02-24 LAB — FOLATE: Folate: 10.8 ng/mL (ref >5.9)

## 2022-02-24 LAB — COMPREHENSIVE METABOLIC PANEL
Albumin: 3.8 (ref 3.5–5.0)
Calcium: 8.9 (ref 8.7–10.7)

## 2022-02-24 LAB — CBC: RBC: 4.47 (ref 3.87–5.11)

## 2022-02-24 LAB — VITAMIN B12: Vitamin B-12: 253 pg/mL (ref 180–914)

## 2022-02-26 DIAGNOSIS — M48061 Spinal stenosis, lumbar region without neurogenic claudication: Secondary | ICD-10-CM | POA: Diagnosis not present

## 2022-02-26 DIAGNOSIS — M4316 Spondylolisthesis, lumbar region: Secondary | ICD-10-CM | POA: Diagnosis not present

## 2022-02-26 DIAGNOSIS — M5416 Radiculopathy, lumbar region: Secondary | ICD-10-CM | POA: Diagnosis not present

## 2022-02-26 DIAGNOSIS — M47817 Spondylosis without myelopathy or radiculopathy, lumbosacral region: Secondary | ICD-10-CM | POA: Diagnosis not present

## 2022-02-26 DIAGNOSIS — M5136 Other intervertebral disc degeneration, lumbar region: Secondary | ICD-10-CM | POA: Diagnosis not present

## 2022-02-28 ENCOUNTER — Encounter: Payer: Self-pay | Admitting: Oncology

## 2022-03-05 DIAGNOSIS — M5136 Other intervertebral disc degeneration, lumbar region: Secondary | ICD-10-CM | POA: Diagnosis not present

## 2022-03-05 DIAGNOSIS — M5416 Radiculopathy, lumbar region: Secondary | ICD-10-CM | POA: Diagnosis not present

## 2022-03-05 DIAGNOSIS — M47817 Spondylosis without myelopathy or radiculopathy, lumbosacral region: Secondary | ICD-10-CM | POA: Diagnosis not present

## 2022-03-05 DIAGNOSIS — M4316 Spondylolisthesis, lumbar region: Secondary | ICD-10-CM | POA: Diagnosis not present

## 2022-03-05 DIAGNOSIS — M48061 Spinal stenosis, lumbar region without neurogenic claudication: Secondary | ICD-10-CM | POA: Diagnosis not present

## 2022-03-06 DIAGNOSIS — M5136 Other intervertebral disc degeneration, lumbar region: Secondary | ICD-10-CM | POA: Diagnosis not present

## 2022-03-06 DIAGNOSIS — M47817 Spondylosis without myelopathy or radiculopathy, lumbosacral region: Secondary | ICD-10-CM | POA: Diagnosis not present

## 2022-03-06 DIAGNOSIS — M5416 Radiculopathy, lumbar region: Secondary | ICD-10-CM | POA: Diagnosis not present

## 2022-03-06 DIAGNOSIS — M4316 Spondylolisthesis, lumbar region: Secondary | ICD-10-CM | POA: Diagnosis not present

## 2022-03-06 DIAGNOSIS — M48061 Spinal stenosis, lumbar region without neurogenic claudication: Secondary | ICD-10-CM | POA: Diagnosis not present

## 2022-03-12 DIAGNOSIS — M48061 Spinal stenosis, lumbar region without neurogenic claudication: Secondary | ICD-10-CM | POA: Diagnosis not present

## 2022-03-12 DIAGNOSIS — M5416 Radiculopathy, lumbar region: Secondary | ICD-10-CM | POA: Diagnosis not present

## 2022-03-12 DIAGNOSIS — M5136 Other intervertebral disc degeneration, lumbar region: Secondary | ICD-10-CM | POA: Diagnosis not present

## 2022-03-12 DIAGNOSIS — M47817 Spondylosis without myelopathy or radiculopathy, lumbosacral region: Secondary | ICD-10-CM | POA: Diagnosis not present

## 2022-03-12 DIAGNOSIS — M4316 Spondylolisthesis, lumbar region: Secondary | ICD-10-CM | POA: Diagnosis not present

## 2022-03-17 ENCOUNTER — Telehealth: Payer: Self-pay | Admitting: Family Medicine

## 2022-03-17 NOTE — Telephone Encounter (Signed)
Patient requests to be called at ph# [phone] regarding vaccinations that Dr. Jerline Pain recommends Patient receive:  Flu RSV Updated Covid when available in Fall

## 2022-03-18 DIAGNOSIS — M5416 Radiculopathy, lumbar region: Secondary | ICD-10-CM | POA: Diagnosis not present

## 2022-03-18 DIAGNOSIS — M5136 Other intervertebral disc degeneration, lumbar region: Secondary | ICD-10-CM | POA: Diagnosis not present

## 2022-03-18 DIAGNOSIS — M48061 Spinal stenosis, lumbar region without neurogenic claudication: Secondary | ICD-10-CM | POA: Diagnosis not present

## 2022-03-18 DIAGNOSIS — M47817 Spondylosis without myelopathy or radiculopathy, lumbosacral region: Secondary | ICD-10-CM | POA: Diagnosis not present

## 2022-03-18 DIAGNOSIS — M4316 Spondylolisthesis, lumbar region: Secondary | ICD-10-CM | POA: Diagnosis not present

## 2022-03-26 NOTE — Telephone Encounter (Signed)
Left message to return call to our office at their convenience.  

## 2022-04-02 DIAGNOSIS — M5416 Radiculopathy, lumbar region: Secondary | ICD-10-CM | POA: Diagnosis not present

## 2022-04-02 DIAGNOSIS — M47817 Spondylosis without myelopathy or radiculopathy, lumbosacral region: Secondary | ICD-10-CM | POA: Diagnosis not present

## 2022-04-02 DIAGNOSIS — M4316 Spondylolisthesis, lumbar region: Secondary | ICD-10-CM | POA: Diagnosis not present

## 2022-04-02 DIAGNOSIS — M5136 Other intervertebral disc degeneration, lumbar region: Secondary | ICD-10-CM | POA: Diagnosis not present

## 2022-04-02 DIAGNOSIS — M48061 Spinal stenosis, lumbar region without neurogenic claudication: Secondary | ICD-10-CM | POA: Diagnosis not present

## 2022-04-03 DIAGNOSIS — D229 Melanocytic nevi, unspecified: Secondary | ICD-10-CM | POA: Diagnosis not present

## 2022-04-03 DIAGNOSIS — Z800 Family history of malignant neoplasm of digestive organs: Secondary | ICD-10-CM | POA: Diagnosis not present

## 2022-04-03 DIAGNOSIS — N649 Disorder of breast, unspecified: Secondary | ICD-10-CM | POA: Diagnosis not present

## 2022-04-03 DIAGNOSIS — I728 Aneurysm of other specified arteries: Secondary | ICD-10-CM | POA: Diagnosis not present

## 2022-04-03 DIAGNOSIS — C50919 Malignant neoplasm of unspecified site of unspecified female breast: Secondary | ICD-10-CM | POA: Diagnosis not present

## 2022-04-03 DIAGNOSIS — C499 Malignant neoplasm of connective and soft tissue, unspecified: Secondary | ICD-10-CM | POA: Diagnosis not present

## 2022-04-03 DIAGNOSIS — Z803 Family history of malignant neoplasm of breast: Secondary | ICD-10-CM | POA: Diagnosis not present

## 2022-04-03 DIAGNOSIS — Z780 Asymptomatic menopausal state: Secondary | ICD-10-CM | POA: Diagnosis not present

## 2022-04-14 ENCOUNTER — Encounter: Payer: Self-pay | Admitting: *Deleted

## 2022-04-14 ENCOUNTER — Ambulatory Visit (INDEPENDENT_AMBULATORY_CARE_PROVIDER_SITE_OTHER): Payer: PPO

## 2022-04-14 DIAGNOSIS — Z0000 Encounter for general adult medical examination without abnormal findings: Secondary | ICD-10-CM

## 2022-04-14 NOTE — Progress Notes (Addendum)
Virtual Visit via Telephone Note  I connected with  Kathy Joseph on 04/14/22 at  9:45 AM EDT by telephone and verified that I am speaking with the correct person using two identifiers.  Medicare Annual Wellness visit completed telephonically due to Covid-19 pandemic.   Persons participating in this call: This Health Coach and this patient.   Location: Patient: home Provider: office   I discussed the limitations, risks, security and privacy concerns of performing an evaluation and management service by telephone and the availability of in person appointments. The patient expressed understanding and agreed to proceed.  Unable to perform video visit due to video visit attempted and failed and/or patient does not have video capability.   Some vital signs may be absent or patient reported.   Willette Brace, LPN   Subjective:   Kathy Joseph is a 66 y.o. female who presents for Medicare Annual (Subsequent) preventive examination.  Review of Systems     Cardiac Risk Factors include: advanced age (>88mn, >>51women);dyslipidemia;obesity (BMI >30kg/m2)     Objective:    There were no vitals filed for this visit. There is no height or weight on file to calculate BMI.     04/14/2022    9:40 AM 04/01/2021    9:39 AM 03/05/2021    1:41 PM 08/02/2020    2:55 PM 03/22/2020    9:35 AM  Advanced Directives  Does Patient Have a Medical Advance Directive? Yes No Yes No No  [place]      Does patient want to make changes to medical advance directive?   Yes (ED - Information included in AVS)    Copy of [place] Chart? No - copy requested      Would patient like information on creating a medical advance directive?  Yes (MAU/Ambulatory/Procedural Areas - Information given)  No - Patient declined Yes (MAU/Ambulatory/Procedural Areas - Information given)    Current Medications (verified) Outpatient Encounter  Medications as of 04/14/2022  Medication Sig   Cholecalciferol (VITAMIN D3) 10 MCG (400 UNIT) tablet Take 400 Units by mouth daily.   cyanocobalamin (VITAMIN B12) 500 MCG tablet Take 500 mcg by mouth daily.   Folic Acid-Vit [id number](HOMOCYSTEINE FORMULA) 0.8-50-0.1 MG TABS    Magnesium 250 MG TABS Take by mouth.   meloxicam (MOBIC) 15 MG tablet Take 15 mg by mouth daily.   Misc Natural Products (GLUCOSAMINE CHOND MSM FORMULA PO) Take by mouth.   multivitamin-lutein (OCUVITE-LUTEIN) CAPS capsule Take 1 capsule by mouth daily.   Probiotic Product (PROBIOTIC PEARLS ADVANTAGE PO) Take by mouth.   FLUZONE HIGH-DOSE QUADRIVALENT 0.7 ML SUSY    No facility-administered encounter medications on file as of 04/14/2022.    Allergies (verified) Cephalosporins   History: Past Medical History:  Diagnosis Date   Arthritis    Atypical nevus 06/11/2018   below left ear, moderate atypia   Atypical nevus 06/11/2018   right mid forehead, moderate to severe (wider shave done 07/08/2018)   [place] 01/20/[place]   melanoma in situ on right distal calf [place] on 07/29/202   Varicose veins of lower extremity    Past Surgical History:  Procedure Laterality Date   ECTOPIC PREGNANCY SURGERY  1986   KNEE SURGERY Right    TONSILLECTOMY AND ADENOIDECTOMY     Family History  Problem Relation Age of Onset   Breast cancer Mother    Colon cancer Father  Diagnosed in 3s.    Breast cancer Sister    Breast cancer Paternal Aunt    Breast cancer Maternal Aunt    Social History   Socioeconomic History   Marital status: Married    Spouse name: Not on file   Number of children: Not on file   Years of education: Not on file   Highest education level: Not on file  Occupational History   Occupation: Retired  Tobacco Use   Smoking status: Never    Passive exposure: Never   Smokeless tobacco: Never  Vaping Use   Vaping Use: Never used  Substance and Sexual Activity   Alcohol use: Yes     Alcohol/week: 3.0 standard drinks of alcohol    Types: 3 Glasses of wine per week   Drug use: Never   Sexual activity: Yes    Partners: Male  Other Topics Concern   Not on file  Social History Narrative   Not on file   Social Determinants of Health   Financial Resource Strain: Low Risk  (04/14/2022)   Overall Financial Resource Strain (CARDIA)    Difficulty of Paying Living Expenses: Not hard at all  Food Insecurity: No Food Insecurity (04/14/2022)   Hunger Vital Sign    Worried About Running Out of Food in the Last Year: Never true    Ran Out of Food in the Last Year: Never true  Transportation Needs: No Transportation Needs (04/14/2022)   PRAPARE - Hydrologist (Medical): No    Lack of Transportation (Non-Medical): No  Physical Activity: Sufficiently Active (04/14/2022)   Exercise Vital Sign    Days of Exercise per Week: 3 days    Minutes of Exercise per Session: 60 min  Stress: No Stress Concern Present (04/14/2022)   [place]    Feeling of Stress : Not at all  Social Connections: [place] (04/14/2022)   Social Connection and Isolation Panel [NHANES]    Frequency of Communication with Friends and Family: More than three times a week    Frequency of Social Gatherings with Friends and Family: More than three times a week    Attends Religious Services: More than 4 times per year    Active Member of [employer] or Organizations: Yes    Attends Archivist Meetings: 1 to 4 times per year    Marital Status: Married    Tobacco Counseling Counseling given: Not Answered   Clinical Intake:  Pre-visit preparation completed: Yes  Pain : No/denies pain     Nutritional Risks: None Diabetes: No  How often do you need to have someone help you when you read instructions, pamphlets, or other written materials from your doctor or pharmacy?: 1 -  Never  Diabetic?no  Interpreter Needed?: No  Information entered by :: Charlott Rakes, LPN   Activities of Daily Living    04/14/2022    9:43 AM  In your present state of health, do you have any difficulty performing the following activities:  Hearing? 0  Vision? 0  Difficulty concentrating or making decisions? 0  Walking or climbing stairs? 0  Dressing or bathing? 0  Doing errands, shopping? 0  Preparing Food and eating ? N  Using the Toilet? N  In the past six months, have you accidently leaked urine? N  Do you have problems with loss of bowel control? N  Managing your Medications? N  Managing your Finances? N  Housekeeping or  managing your Housekeeping? N    Patient Care Team: Vivi Barrack, MD as PCP - General (Family Medicine) Clark-Burning, Anderson Malta, PA-C (Inactive) (Dermatology) Warren Danes, PA-C as Physician Assistant (Dermatology)  Indicate any recent Medical Services you may have received from other than [facility] providers in the past year (date may be approximate).     Assessment:   This is a routine wellness examination for [place].  Hearing/Vision screen Hearing Screening - Comments:: Pt denies any hearing issues  Vision Screening - Comments:: Pt follows up with DrJennifer Ernestine Mcmurray   Dietary issues and exercise activities discussed: Current Exercise Habits: Home exercise routine, Type of exercise: strength training/weights, Time (Minutes): > 60, Frequency (Times/Week): 3, Weekly Exercise (Minutes/Week): 0   Goals Addressed             This Visit's Progress    Patient Stated       Lose weight        Depression Screen    04/14/2022    9:41 AM 04/25/2021    9:47 AM 04/01/2021    9:37 AM 03/22/2020    9:30 AM 03/14/2019    2:56 PM 03/10/2018    9:55 AM  PHQ 2/9 Scores  PHQ - 2 Score 0 0 0 1 0 0    Fall Risk    04/14/2022    9:43 AM 04/25/2021    9:47 AM 04/01/2021    9:40 AM 03/05/2021    1:42 PM 03/22/2020    9:36 AM  Fall Risk    Falls in the past year? 0 0 0 0 0  Number falls in past yr: 0  0  0  Injury with Fall? 0  0  0  Risk for fall due to : No Fall Risks;Impaired vision;Impaired balance/gait No Fall Risks Impaired vision;Impaired mobility  Impaired vision  Risk for fall due to: Comment related to back concerns      Follow up Falls prevention discussed  Falls prevention discussed  Falls prevention discussed    FALL RISK PREVENTION PERTAINING TO THE HOME:  Any stairs in or around the home? Yes  If so, are there any without handrails? No  Home free of loose throw rugs in walkways, pet beds, electrical cords, etc? Yes  Adequate lighting in your home to reduce risk of falls? Yes   ASSISTIVE DEVICES UTILIZED TO PREVENT FALLS:  Life alert? No  Use of a cane, walker or w/c? No  Grab bars in the bathroom? No  Shower chair or bench in shower? Yes  Elevated toilet seat or a handicapped toilet? No   TIMED UP AND GO:  Was the test performed? No .   Cognitive Function:        04/14/2022    9:44 AM 04/01/2021    9:43 AM 03/22/2020    9:38 AM  6CIT Screen  What Year? 0 points 0 points 0 points  What month? 0 points 0 points 0 points  What time? 0 points 0 points   Count back from 20 0 points 0 points 0 points  Months in reverse 0 points 0 points 0 points  Repeat phrase 0 points 0 points 0 points  Total Score 0 points 0 points     Immunizations Immunization History  Administered Date(s) Administered   Fluad Quad(high Dose 65+) 03/14/2019   Influenza,inj,Quad PF,6+ Mos 05/04/2018   Moderna Sars-Covid-2 Vaccination 08/24/2019, 09/21/2019, 05/28/2020   PNEUMOCOCCAL CONJUGATE-20 06/21/2020   Pfizer Covid-19 Vaccine Bivalent Booster 37yr & up 11/30/2020  Pfizer Covid-19 Vaccine Bivalent Booster 5y-11y 06/25/2021   Pneumococcal Polysaccharide-23 03/14/2019    TDAP status: Due, Education has been provided regarding the importance of this vaccine. Advised may receive this vaccine at local pharmacy or  Health Dept. Aware to provide a copy of the vaccination record if obtained from local pharmacy or Health Dept. Verbalized acceptance and understanding.  Flu Vaccine status: Due, Education has been provided regarding the importance of this vaccine. Advised may receive this vaccine at local pharmacy or Health Dept. Aware to provide a copy of the vaccination record if obtained from local pharmacy or Health Dept. Verbalized acceptance and understanding.  Pneumococcal vaccine status: Up to date  Covid-19 vaccine status: Completed vaccines  Qualifies for Shingles Vaccine? Yes   Zostavax completed No   Shingrix Completed?: No.    Education has been provided regarding the importance of this vaccine. Patient has been advised to call insurance company to determine out of pocket expense if they have not yet received this vaccine. Advised may also receive vaccine at local pharmacy or Health Dept. Verbalized acceptance and understanding.  Screening Tests Health Maintenance  Topic Date Due   Zoster Vaccines- Shingrix (1 of 2) Never done   COVID-19 Vaccine (6 - Moderna risk series) 08/20/2021   INFLUENZA VACCINE  02/18/2022   MAMMOGRAM  11/19/2022 (Originally 10/09/2022)   TETANUS/TDAP  11/19/2022 (Originally 11/06/1974)   COLONOSCOPY (Pts 45-29yr Insurance coverage will need to be confirmed)  01/27/2027   Pneumonia Vaccine 68 Years old  Completed   DEXA SCAN  Completed   Hepatitis C Screening  Completed   HPV VACCINES  Aged Out    Health Maintenance  Health Maintenance Due  Topic Date Due   Zoster Vaccines- Shingrix (1 of 2) Never done   COVID-19 Vaccine (6 - Moderna risk series) 08/20/2021   INFLUENZA VACCINE  02/18/2022    Colorectal cancer screening: Type of screening: Colonoscopy. Completed 01/26/17. Repeat every 10 years  Mammogram status: Completed 10/08/20. Repeat every year  Bone Density status: Completed 03/30/19. Results reflect: Bone density results: NORMAL. Repeat every 2  years.   Additional Screening:  Hepatitis C Screening:  Completed 03/14/19  Vision Screening: Recommended annual ophthalmology exams for early detection of glaucoma and other disorders of the eye. Is the patient up to date with their annual eye exam?  Yes  Who is the provider or what is the name of the office in which the patient attends annual eye exams? Dr JAlen BlewIf pt is not established with a provider, would they like to be referred to a provider to establish care? No .   Dental Screening: Recommended annual dental exams for proper oral hygiene  Community Resource Referral / Chronic Care Management: CRR required this visit?  No   CCM required this visit?  No      Plan:     I have personally reviewed and noted the following in the patient's chart:   Medical and social history Use of alcohol, tobacco or illicit drugs  Current medications and supplements including opioid prescriptions. Patient is not currently taking opioid prescriptions. Functional ability and status Nutritional status Physical activity Advanced directives List of other physicians Hospitalizations, surgeries, and ER visits in previous 12 months Vitals Screenings to include cognitive, depression, and falls Referrals and appointments  In addition, I have reviewed and discussed with patient certain preventive protocols, quality metrics, and best practice recommendations. A written personalized care plan for preventive services as well as general preventive health recommendations were  provided to patient.     Willette Brace, LPN   [id number]   Nurse Notes: none

## 2022-04-14 NOTE — Patient Instructions (Signed)
Kathy Joseph , Thank you for taking time to come for your Medicare Wellness Visit. I appreciate your ongoing commitment to your health goals. Please review the following plan we discussed and let me know if I can assist you in the future.   These are the goals we discussed:  Goals      Patient Stated     Losing weight and manage it better     Patient Stated     Looking for other options for weight loss     Patient Stated     Lose weight         This is a list of the screening recommended for you and due dates:  Health Maintenance  Topic Date Due   Zoster (Shingles) Vaccine (1 of 2) Never done   COVID-19 Vaccine (6 - Moderna risk series) 08/20/2021   Flu Shot  02/18/2022   Mammogram  11/19/2022*   Tetanus Vaccine  11/19/2022*   Colon Cancer Screening  01/27/2027   Pneumonia Vaccine  Completed   DEXA scan (bone density measurement)  Completed   Hepatitis C Screening: USPSTF Recommendation to screen - Ages 70-79 yo.  Completed   HPV Vaccine  Aged Out  *Topic was postponed. The date shown is not the original due date.    Advanced directives: Please bring a copy of your health care power of attorney and living will to the office at your convenience.  Conditions/risks identified: lose weight   Next appointment: Follow up in one year for your annual wellness visit    Preventive Care 65 Years and Older, Female Preventive care refers to lifestyle choices and visits with your health care provider that can promote health and wellness. What does preventive care include? A yearly physical exam. This is also called an annual well check. Dental exams once or twice a year. Routine eye exams. Ask your health care provider how often you should have your eyes checked. Personal lifestyle choices, including: Daily care of your teeth and gums. Regular physical activity. Eating a healthy diet. Avoiding tobacco and drug use. Limiting alcohol use. Practicing safe sex. Taking low-dose  aspirin every day. Taking vitamin and mineral supplements as recommended by your health care provider. What happens during an annual well check? The services and screenings done by your health care provider during your annual well check will depend on your age, overall health, lifestyle risk factors, and family history of disease. Counseling  Your health care provider may ask you questions about your: Alcohol use. Tobacco use. Drug use. Emotional well-being. Home and relationship well-being. Sexual activity. Eating habits. History of falls. Memory and ability to understand (cognition). Work and work Statistician. Reproductive health. Screening  You may have the following tests or measurements: Height, weight, and BMI. Blood pressure. Lipid and cholesterol levels. These may be checked every 5 years, or more frequently if you are over 74 years old. Skin check. Lung cancer screening. You may have this screening every year starting at age 67 if you have a 30-pack-year history of smoking and currently smoke or have quit within the past 15 years. Fecal occult blood test (FOBT) of the stool. You may have this test every year starting at age 12. Flexible sigmoidoscopy or colonoscopy. You may have a sigmoidoscopy every 5 years or a colonoscopy every 10 years starting at age 8. Hepatitis C blood test. Hepatitis B blood test. Sexually transmitted disease (STD) testing. Diabetes screening. This is done by checking your blood sugar (glucose) after you  have not eaten for a while (fasting). You may have this done every 1-3 years. Bone density scan. This is done to screen for osteoporosis. You may have this done starting at age 92. Mammogram. This may be done every 1-2 years. Talk to your health care provider about how often you should have regular mammograms. Talk with your health care provider about your test results, treatment options, and if necessary, the need for more tests. Vaccines  Your  health care provider may recommend certain vaccines, such as: Influenza vaccine. This is recommended every year. Tetanus, diphtheria, and acellular pertussis (Tdap, Td) vaccine. You may need a Td booster every 10 years. Zoster vaccine. You may need this after age 78. Pneumococcal 13-valent conjugate (PCV13) vaccine. One dose is recommended after age 62. Pneumococcal polysaccharide (PPSV23) vaccine. One dose is recommended after age 6. Talk to your health care provider about which screenings and vaccines you need and how often you need them. This information is not intended to replace advice given to you by your health care provider. Make sure you discuss any questions you have with your health care provider. Document Released: 08/03/2015 Document Revised: 03/26/2016 Document Reviewed: 05/08/2015 Elsevier Interactive Patient Education  2017 [place] Prevention in the Home Falls can cause injuries. They can happen to people of all ages. There are many things you can do to make your home safe and to help prevent falls. What can I do on the outside of my home? Regularly fix the edges of walkways and driveways and fix any cracks. Remove anything that might make you trip as you walk through a door, such as a raised step or threshold. Trim any bushes or trees on the path to your home. Use bright outdoor lighting. Clear any walking paths of anything that might make someone trip, such as rocks or tools. Regularly check to see if handrails are loose or broken. Make sure that both sides of any steps have handrails. Any raised decks and porches should have guardrails on the edges. Have any leaves, snow, or ice cleared regularly. Use sand or salt on walking paths during winter. Clean up any spills in your garage right away. This includes oil or grease spills. What can I do in the bathroom? Use night lights. Install grab bars by the toilet and in the tub and shower. Do not use towel bars as  grab bars. Use non-skid mats or decals in the tub or shower. If you need to sit down in the shower, use a plastic, non-slip stool. Keep the floor dry. Clean up any water that spills on the floor as soon as it happens. Remove soap buildup in the tub or shower regularly. Attach bath mats securely with double-sided non-slip rug tape. Do not have throw rugs and other things on the floor that can make you trip. What can I do in the bedroom? Use night lights. Make sure that you have a light by your bed that is easy to reach. Do not use any sheets or blankets that are too big for your bed. They should not hang down onto the floor. Have a firm chair that has side arms. You can use this for support while you get dressed. Do not have throw rugs and other things on the floor that can make you trip. What can I do in the kitchen? Clean up any spills right away. Avoid walking on wet floors. Keep items that you use a lot in easy-to-reach places. If you need  to reach something above you, use a strong step stool that has a grab bar. Keep electrical cords out of the way. Do not use floor polish or wax that makes floors slippery. If you must use wax, use non-skid floor wax. Do not have throw rugs and other things on the floor that can make you trip. What can I do with my stairs? Do not leave any items on the stairs. Make sure that there are handrails on both sides of the stairs and use them. Fix handrails that are broken or loose. Make sure that handrails are as long as the stairways. Check any carpeting to make sure that it is firmly attached to the stairs. Fix any carpet that is loose or worn. Avoid having throw rugs at the top or bottom of the stairs. If you do have throw rugs, attach them to the floor with carpet tape. Make sure that you have a light switch at the top of the stairs and the bottom of the stairs. If you do not have them, ask someone to add them for you. What else can I do to help prevent  falls? Wear shoes that: Do not have high heels. Have rubber bottoms. Are comfortable and fit you well. Are closed at the toe. Do not wear sandals. If you use a stepladder: Make sure that it is fully opened. Do not climb a closed stepladder. Make sure that both sides of the stepladder are locked into place. Ask someone to hold it for you, if possible. Clearly mark and make sure that you can see: Any grab bars or handrails. First and last steps. Where the edge of each step is. Use tools that help you move around (mobility aids) if they are needed. These include: Canes. Walkers. Scooters. Crutches. Turn on the lights when you go into a dark area. Replace any light bulbs as soon as they burn out. Set up your furniture so you have a clear path. Avoid moving your furniture around. If any of your floors are uneven, fix them. If there are any pets around you, be aware of where they are. Review your medicines with your doctor. Some medicines can make you feel dizzy. This can increase your chance of falling. Ask your doctor what other things that you can do to help prevent falls. This information is not intended to replace advice given to you by your health care provider. Make sure you discuss any questions you have with your health care provider. Document Released: 05/03/2009 Document Revised: 12/13/2015 Document Reviewed: 08/11/2014 Elsevier Interactive Patient Education  2017 [employer].

## 2022-05-15 DIAGNOSIS — M4725 Other spondylosis with radiculopathy, thoracolumbar region: Secondary | ICD-10-CM | POA: Diagnosis not present

## 2022-05-15 DIAGNOSIS — M5388 Other specified dorsopathies, sacral and sacrococcygeal region: Secondary | ICD-10-CM | POA: Diagnosis not present

## 2022-05-15 DIAGNOSIS — M9904 Segmental and somatic dysfunction of sacral region: Secondary | ICD-10-CM | POA: Diagnosis not present

## 2022-05-15 DIAGNOSIS — M9902 Segmental and somatic dysfunction of thoracic region: Secondary | ICD-10-CM | POA: Diagnosis not present

## 2022-05-15 DIAGNOSIS — M5431 Sciatica, right side: Secondary | ICD-10-CM | POA: Diagnosis not present

## 2022-05-15 DIAGNOSIS — M9903 Segmental and somatic dysfunction of lumbar region: Secondary | ICD-10-CM | POA: Diagnosis not present

## 2022-05-26 DIAGNOSIS — M1711 Unilateral primary osteoarthritis, right knee: Secondary | ICD-10-CM | POA: Diagnosis not present

## 2022-05-30 ENCOUNTER — Ambulatory Visit (INDEPENDENT_AMBULATORY_CARE_PROVIDER_SITE_OTHER): Payer: PPO | Admitting: Family Medicine

## 2022-05-30 ENCOUNTER — Encounter: Payer: Self-pay | Admitting: Family Medicine

## 2022-05-30 VITALS — BP 122/74 | HR 68 | Temp 97.9°F | Ht 67.0 in | Wt 239.0 lb

## 2022-05-30 DIAGNOSIS — H5789 Other specified disorders of eye and adnexa: Secondary | ICD-10-CM

## 2022-05-30 NOTE — Patient Instructions (Signed)
Johnson's baby shampoo and water  solution and wash 2x/day.   Monitor.  Worse, etc let us know

## 2022-05-30 NOTE — Progress Notes (Signed)
Subjective:     Patient ID: Kathy Joseph, female    DOB: 09-04-1955, 66 y.o.   MRN: [id number]  Chief Complaint  Patient presents with   Eye Problem    Mucus on left eye, noticed Monday, used systaine eye drops Denies any pain, discomfort on eyelid     HPI Mucus from L eye since 11/6.  Using systaine eye drops.  No pain. No redness. Occurring every am. Feels something there but not a lot of drainage.  Minimal later in day. A little congestion.  No visual changes.  No feather pillows. No pets.   Health Maintenance Due  Topic Date Due   Zoster Vaccines- Shingrix (1 of 2) Never done   COVID-19 Vaccine (6 - Moderna risk series) 08/20/2021   INFLUENZA VACCINE  02/18/2022    Past Medical History:  Diagnosis Date   Arthritis    Atypical nevus 06/11/2018   below left ear, moderate atypia   Atypical nevus 06/11/2018   right mid forehead, moderate to severe (wider shave done 07/08/2018)   Melanoma ([place]) 01/20/2019   melanoma in situ on right distal calf TX on 07/29/202   Varicose veins of lower extremity     Past Surgical History:  Procedure Laterality Date   ECTOPIC PREGNANCY SURGERY  1986   KNEE SURGERY Right    TONSILLECTOMY AND ADENOIDECTOMY      Outpatient Medications Prior to Visit  Medication Sig Dispense Refill   Cholecalciferol (VITAMIN D3) 10 MCG (400 UNIT) tablet Take 400 Units by mouth daily.     cyanocobalamin (VITAMIN B12) 500 MCG tablet Take 500 mcg by mouth daily.     FLUZONE HIGH-DOSE QUADRIVALENT 0.7 ML SUSY      Folic Acid-Vit Q0-HKV Q25 (HOMOCYSTEINE FORMULA) 0.8-50-0.1 MG TABS      Magnesium 250 MG TABS Take by mouth.     meloxicam (MOBIC) 15 MG tablet Take 15 mg by mouth daily.     Misc Natural Products (GLUCOSAMINE CHOND MSM FORMULA PO) Take by mouth.     multivitamin-lutein (OCUVITE-LUTEIN) CAPS capsule Take 1 capsule by mouth daily.     Probiotic Product (PROBIOTIC PEARLS ADVANTAGE PO) Take by mouth.     No facility-administered medications  prior to visit.    Allergies  Allergen Reactions   Cephalosporins Hives   ROS neg/noncontributory except as noted HPI/below      Objective:     BP 122/74   Pulse 68   Temp 97.9 F (36.6 C) (Temporal)   Ht '5\' 7"'$  (1.702 m)   Wt 239 lb (108.4 kg)   SpO2 96%   BMI 37.43 kg/m  Wt Readings from Last 3 Encounters:  05/30/22 239 lb (108.4 kg)  02/24/22 235 lb 4.8 oz (106.7 kg)  01/08/22 237 lb 9.6 oz (107.8 kg)    Physical Exam   Gen: WDWN NAD HEENT: NCAT, conjunctiva not injected, sclera nonicteric.  Eomi.  No FB seen.  Sl poss irrit L lower, lateral lid TM WNL B, OP moist, no exudates  MSK: no gross abnormalities.  NEURO: A&O x3.  CN II-XII intact.  PSYCH: normal mood. Good eye contact     Assessment & Plan:   Problem List Items Addressed This Visit   None Visit Diagnoses     Discharge of eye, left    -  Primary      Discharge of L eye-acute.  No color, not all day.  Poss irrit, allergies.  Poss a little irrit lower lid-johnson's baby shampoo washes  bid.  Sustaine ok.  Soaks.  Worse, color, red eye, let us know.    No orders of the defined types were placed in this encounter.   Wellington Hampshire, MD

## 2022-06-03 DIAGNOSIS — M1711 Unilateral primary osteoarthritis, right knee: Secondary | ICD-10-CM | POA: Diagnosis not present

## 2022-06-03 DIAGNOSIS — Z0389 Encounter for observation for other suspected diseases and conditions ruled out: Secondary | ICD-10-CM | POA: Diagnosis not present

## 2022-06-11 DIAGNOSIS — M5431 Sciatica, right side: Secondary | ICD-10-CM | POA: Diagnosis not present

## 2022-06-11 DIAGNOSIS — M5388 Other specified dorsopathies, sacral and sacrococcygeal region: Secondary | ICD-10-CM | POA: Diagnosis not present

## 2022-06-11 DIAGNOSIS — M9903 Segmental and somatic dysfunction of lumbar region: Secondary | ICD-10-CM | POA: Diagnosis not present

## 2022-06-11 DIAGNOSIS — M4725 Other spondylosis with radiculopathy, thoracolumbar region: Secondary | ICD-10-CM | POA: Diagnosis not present

## 2022-06-11 DIAGNOSIS — M9904 Segmental and somatic dysfunction of sacral region: Secondary | ICD-10-CM | POA: Diagnosis not present

## 2022-06-11 DIAGNOSIS — M9902 Segmental and somatic dysfunction of thoracic region: Secondary | ICD-10-CM | POA: Diagnosis not present

## 2022-06-20 DIAGNOSIS — L820 Inflamed seborrheic keratosis: Secondary | ICD-10-CM | POA: Diagnosis not present

## 2022-06-24 DIAGNOSIS — Z6837 Body mass index (BMI) 37.0-37.9, adult: Secondary | ICD-10-CM | POA: Diagnosis not present

## 2022-06-24 DIAGNOSIS — M5441 Lumbago with sciatica, right side: Secondary | ICD-10-CM | POA: Diagnosis not present

## 2022-06-24 DIAGNOSIS — G8929 Other chronic pain: Secondary | ICD-10-CM | POA: Diagnosis not present

## 2022-06-24 DIAGNOSIS — M5442 Lumbago with sciatica, left side: Secondary | ICD-10-CM | POA: Diagnosis not present

## 2022-06-25 DIAGNOSIS — M9903 Segmental and somatic dysfunction of lumbar region: Secondary | ICD-10-CM | POA: Diagnosis not present

## 2022-06-25 DIAGNOSIS — M5388 Other specified dorsopathies, sacral and sacrococcygeal region: Secondary | ICD-10-CM | POA: Diagnosis not present

## 2022-06-25 DIAGNOSIS — M4725 Other spondylosis with radiculopathy, thoracolumbar region: Secondary | ICD-10-CM | POA: Diagnosis not present

## 2022-06-25 DIAGNOSIS — M5431 Sciatica, right side: Secondary | ICD-10-CM | POA: Diagnosis not present

## 2022-06-25 DIAGNOSIS — M9902 Segmental and somatic dysfunction of thoracic region: Secondary | ICD-10-CM | POA: Diagnosis not present

## 2022-06-25 DIAGNOSIS — M9904 Segmental and somatic dysfunction of sacral region: Secondary | ICD-10-CM | POA: Diagnosis not present

## 2022-06-26 ENCOUNTER — Telehealth: Payer: Self-pay | Admitting: Family Medicine

## 2022-06-26 NOTE — Telephone Encounter (Signed)
Patient states she is scheduled for a closed MRI on on 07/02/22 and has claustrophobia.  Patient requests a RX for a medication that will help Patient relax during the MRI. Patient states she once was prescribed 1 tablet that she took 30 minutes before a procedure that really helped but does not remember the name of the medication.  Pharmacy is:   [id number] [place], [address] Phone: [phone]  Fax: [phone]

## 2022-06-27 NOTE — Telephone Encounter (Signed)
Please advise 

## 2022-06-30 NOTE — Telephone Encounter (Signed)
Has she reached out to the physician that ordered the MRI? They are usually the ones that manage sedation orders.  Algis Greenhouse. Jerline Pain, MD 06/30/2022 12:45 PM

## 2022-07-01 NOTE — Telephone Encounter (Signed)
Patient notified.  Need to reached out to the physician ordering MRI  Verbalized understanding

## 2022-07-02 DIAGNOSIS — M47816 Spondylosis without myelopathy or radiculopathy, lumbar region: Secondary | ICD-10-CM | POA: Diagnosis not present

## 2022-07-02 DIAGNOSIS — M5442 Lumbago with sciatica, left side: Secondary | ICD-10-CM | POA: Diagnosis not present

## 2022-07-02 DIAGNOSIS — M5117 Intervertebral disc disorders with radiculopathy, lumbosacral region: Secondary | ICD-10-CM | POA: Diagnosis not present

## 2022-07-16 DIAGNOSIS — M9903 Segmental and somatic dysfunction of lumbar region: Secondary | ICD-10-CM | POA: Diagnosis not present

## 2022-07-16 DIAGNOSIS — M5388 Other specified dorsopathies, sacral and sacrococcygeal region: Secondary | ICD-10-CM | POA: Diagnosis not present

## 2022-07-16 DIAGNOSIS — M9902 Segmental and somatic dysfunction of thoracic region: Secondary | ICD-10-CM | POA: Diagnosis not present

## 2022-07-16 DIAGNOSIS — M4305 Spondylolysis, thoracolumbar region: Secondary | ICD-10-CM | POA: Diagnosis not present

## 2022-07-16 DIAGNOSIS — M5116 Intervertebral disc disorders with radiculopathy, lumbar region: Secondary | ICD-10-CM | POA: Diagnosis not present

## 2022-07-16 DIAGNOSIS — M9904 Segmental and somatic dysfunction of sacral region: Secondary | ICD-10-CM | POA: Diagnosis not present

## 2022-07-30 DIAGNOSIS — M205X2 Other deformities of toe(s) (acquired), left foot: Secondary | ICD-10-CM | POA: Diagnosis not present

## 2022-07-30 DIAGNOSIS — M79675 Pain in left toe(s): Secondary | ICD-10-CM | POA: Diagnosis not present

## 2022-07-30 DIAGNOSIS — L84 Corns and callosities: Secondary | ICD-10-CM | POA: Diagnosis not present

## 2022-07-31 DIAGNOSIS — M1711 Unilateral primary osteoarthritis, right knee: Secondary | ICD-10-CM | POA: Diagnosis not present

## 2022-08-08 DIAGNOSIS — M5442 Lumbago with sciatica, left side: Secondary | ICD-10-CM | POA: Diagnosis not present

## 2022-08-08 DIAGNOSIS — Z6837 Body mass index (BMI) 37.0-37.9, adult: Secondary | ICD-10-CM | POA: Diagnosis not present

## 2022-08-13 DIAGNOSIS — M47895 Other spondylosis, thoracolumbar region: Secondary | ICD-10-CM | POA: Diagnosis not present

## 2022-08-13 DIAGNOSIS — M9904 Segmental and somatic dysfunction of sacral region: Secondary | ICD-10-CM | POA: Diagnosis not present

## 2022-08-13 DIAGNOSIS — M9903 Segmental and somatic dysfunction of lumbar region: Secondary | ICD-10-CM | POA: Diagnosis not present

## 2022-08-13 DIAGNOSIS — M4726 Other spondylosis with radiculopathy, lumbar region: Secondary | ICD-10-CM | POA: Diagnosis not present

## 2022-08-13 DIAGNOSIS — M5388 Other specified dorsopathies, sacral and sacrococcygeal region: Secondary | ICD-10-CM | POA: Diagnosis not present

## 2022-08-13 DIAGNOSIS — M9902 Segmental and somatic dysfunction of thoracic region: Secondary | ICD-10-CM | POA: Diagnosis not present

## 2022-08-27 ENCOUNTER — Encounter: Payer: Self-pay | Admitting: Oncology

## 2022-08-27 ENCOUNTER — Other Ambulatory Visit: Payer: Self-pay | Admitting: Oncology

## 2022-08-27 ENCOUNTER — Telehealth: Payer: Self-pay | Admitting: Oncology

## 2022-08-27 ENCOUNTER — Inpatient Hospital Stay: Payer: PPO | Attending: Oncology | Admitting: Oncology

## 2022-08-27 ENCOUNTER — Inpatient Hospital Stay: Payer: PPO

## 2022-08-27 VITALS — BP 138/73 | HR 63 | Temp 98.1°F | Resp 18 | Ht 67.0 in | Wt 243.1 lb

## 2022-08-27 DIAGNOSIS — C50919 Malignant neoplasm of unspecified site of unspecified female breast: Secondary | ICD-10-CM

## 2022-08-27 DIAGNOSIS — Z800 Family history of malignant neoplasm of digestive organs: Secondary | ICD-10-CM | POA: Insufficient documentation

## 2022-08-27 DIAGNOSIS — Z803 Family history of malignant neoplasm of breast: Secondary | ICD-10-CM | POA: Insufficient documentation

## 2022-08-27 DIAGNOSIS — D704 Cyclic neutropenia: Secondary | ICD-10-CM | POA: Diagnosis not present

## 2022-08-27 DIAGNOSIS — C50911 Malignant neoplasm of unspecified site of right female breast: Secondary | ICD-10-CM | POA: Insufficient documentation

## 2022-08-27 DIAGNOSIS — M810 Age-related osteoporosis without current pathological fracture: Secondary | ICD-10-CM

## 2022-08-27 LAB — CMP (CANCER CENTER ONLY)
ALT: 21 U/L (ref 0–44)
AST: 22 U/L (ref 15–41)
Albumin: 3.8 g/dL (ref 3.5–5.0)
Alkaline Phosphatase: 68 U/L (ref 38–126)
Anion gap: 9 (ref 5–15)
BUN: 18 mg/dL (ref 8–23)
CO2: 28 mmol/L (ref 22–32)
Calcium: 8.8 mg/dL — ABNORMAL LOW (ref 8.9–10.3)
Chloride: 103 mmol/L (ref 98–111)
Creatinine: 0.87 mg/dL (ref 0.44–1.00)
GFR, Estimated: >60 mL/min (ref >60)
Glucose, Bld: 100 mg/dL — ABNORMAL HIGH (ref 70–99)
Potassium: 4.3 mmol/L (ref 3.5–5.1)
Sodium: 140 mmol/L (ref 135–145)
Total Bilirubin: 0.7 mg/dL (ref 0.3–1.2)
Total Protein: 6.8 g/dL (ref 6.5–8.1)

## 2022-08-27 LAB — CBC WITH DIFFERENTIAL (CANCER CENTER ONLY)
Abs Immature Granulocytes: 0 10*3/uL (ref 0.00–0.07)
Basophils Absolute: 0 10*3/uL (ref 0.0–0.1)
Basophils Relative: 1 %
Eosinophils Absolute: 0.2 10*3/uL (ref 0.0–0.5)
Eosinophils Relative: 6 %
HCT: 41.3 % (ref 36.0–46.0)
Hemoglobin: 13.3 g/dL (ref 12.0–15.0)
Immature Granulocytes: 0 %
Lymphocytes Relative: 36 %
Lymphs Abs: 1.1 10*3/uL (ref 0.7–4.0)
MCH: 31.4 pg (ref 26.0–34.0)
MCHC: 32.2 g/dL (ref 30.0–36.0)
MCV: 97.4 fL (ref 80.0–100.0)
Monocytes Absolute: 0.3 10*3/uL (ref 0.1–1.0)
Monocytes Relative: 10 %
Neutro Abs: 1.5 10*3/uL — ABNORMAL LOW (ref 1.7–7.7)
Neutrophils Relative %: 47 %
Platelet Count: 190 10*3/uL (ref 150–400)
RBC: 4.24 MIL/uL (ref 3.87–5.11)
RDW: 11.4 % — ABNORMAL LOW (ref 11.5–15.5)
WBC Count: 3.1 10*3/uL — ABNORMAL LOW (ref 4.0–10.5)
nRBC: 0 % (ref 0.0–0.2)

## 2022-08-27 NOTE — Progress Notes (Signed)
[facility]  [address] [place],  [place]  [address] [phone]  Clinic Day:  08/27/22   Referring physician: Vivi Barrack, MD   CHIEF COMPLAINT:  CC: History of mammary angiosarcoma   Current Treatment:  Surveillance   HISTORY OF PRESENT ILLNESS:  Kathy Joseph is a 67 y.o. female with mammary angiosarcoma of the right breast, diagnosed in August 2021.  This was found on a screening mammogram in November 2020, where she was found to have possible asymmetry in the right breast.  Diagnostic right mammogram and ultrasound revealed the asymmetry to persist on additional views, but ultrasound did not reveal any suspicious masses.  There were areas of focal fibroglandular tissue that may correspond to the abnormality.  Six-month follow-up right diagnostic mammogram and ultrasound were recommended.  In May 2021, there was an increase in the focal asymmetry in the right outer breast.  Ultrasound revealed an irregular mass at 8 o'clock 6 cm from the nipple measuring 1.9 x 1.7 x 1.1 cm.  No enlarged axillary lymph nodes were seen.  Biopsy revealed an angiolipoma with a differential diagnosis of fat necrosis versus malignancy.  The radiologist felt the findings were concordant, but as he was still concerned about the appearance on ultrasound, excision was recommended, so she was referred to Dr. Lilia Pro.  She underwent needle localized lumpectomy on August 21st and surgical pathology from this procedure revealed mammary angiosarcoma.  These results were sent to [facility], and confirmed.  Since even low grade lesions of this type have a substantial risk of distant metastasis, tumors of this type are generally treated by mastectomy.  CT chest from September 1st revealed no evidence of metastatic disease in the chest.  Bilateral calcified pulmonary nodules are consistent with prior granulomatous disease.  There is a 9 mm subpleural nodule in the left upper lobe with  curvilinear calcifications.  Regardless this appears benign.  Multiple fat density lesions involving both kidneys are consistent with multiple angiomyolipomas.  There is also an indeterminate 15 mm lesion arising from the posterior right kidney, which may represent a lipid poor angiomyolipoma.  Further characterization with renal protocol MRI was recommended.  Hypodense lesion in the left lobe of the thyroid gland measuring 12 mm.  Given size, this is not clinically significant.  She has been seen at [facility] in consultation and will be returning for their recommendations.  She started menarche at age 29.  She has had 4 pregnancies, with 3 live births.  Her 1st live birth was at age 40.  She was placed on tamoxifen as chemoprevention for 2-3 years, but this was discontinued after she developed a superficial venous thrombosis of the left leg.  She took oral contraceptives for 9 years after her last child.  The professors at [facility] reviewed the surgical pathology from her procedure, and they did not recommend any further excision at this time as her margins were clear.  The closest margin was 1.5 mm.  They did recommend close follow up with breast MRI and CT imaging every 6 months for the next 2 years. CT and MRI imaging from March 2022 at [facility] are benign.   INTERVAL HISTORY:  Kathy Joseph is here for routine follow up for history of mammary angiosarcoma.  She was also found to have a splenic aneurysm. She also continues follow up with Dr. Lilia Pro.She had a herniated disk 18 years ago and now has that again. Patient states that she is well and only complains  of inconsistent pains by her back bra strap in her thoracic area. She does have mild scoliosis. I see one keratosis in the skin of the right mid back. She will be going to [facility] in March for an MRI and CT scan. She did have her annual mammogram in 2023, that was clear. She does take Magnesium, B-12, and vitamin D for her bones. Her last bone density scan was in  2020 and was normal, but I think should be repeated this year. Patient believes she may require right knee replacement surgery sometime in the future. Her labs are pending today. I will see her in 6 months with CBC and CMP and will schedule her next bone density scan now, and I will call her with the results. She denies signs of infection such as sore throat, sinus drainage, cough, or urinary symptoms.  She denies fevers or recurrent chills. She denies pain. She denies nausea, vomiting, chest pain, dyspnea or cough. Her appetite is well and her weight has increased 4 pounds over last 3 months .  Review of Systems  Constitutional: Negative.  Negative for appetite change, chills, diaphoresis, fatigue, fever and unexpected weight change.  HENT:  Negative.  Negative for hearing loss, lump/mass, mouth sores, nosebleeds, sore throat, tinnitus, trouble swallowing and voice change.   Eyes: Negative.  Negative for eye problems and icterus.  Respiratory: Negative.  Negative for chest tightness, cough, hemoptysis, shortness of breath and wheezing.   Cardiovascular: Negative.  Negative for chest pain, leg swelling and palpitations.  Gastrointestinal: Negative.  Negative for abdominal distention, abdominal pain, blood in stool, constipation, diarrhea, nausea, rectal pain and vomiting.  Endocrine: Negative.   Genitourinary: Negative.  Negative for bladder incontinence, difficulty urinating, dyspareunia, dysuria, frequency, hematuria, menstrual problem, nocturia, pelvic pain, vaginal bleeding and vaginal discharge.   Musculoskeletal:  Positive for arthralgias. Negative for back pain, flank pain, gait problem, myalgias, neck pain and neck stiffness.       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    Skin: Negative.  Negative for itching, rash and wound.  Neurological: Negative.  Negative for dizziness, extremity weakness, gait problem, headaches, light-headedness, numbness, seizures and speech difficulty.  Hematological:  Negative.  Negative for adenopathy. Does not bruise/bleed easily.  Psychiatric/Behavioral: Negative.  Negative for confusion, decreased concentration, depression, sleep disturbance and suicidal ideas. The patient is not nervous/anxious.      VITALS:  Blood pressure 138/73, pulse 63, temperature 98.1 F (36.7 C), temperature source Oral, resp. rate 18, height 5' 7"$  (1.702 m), weight 243 lb 1.6 oz (110.3 kg), SpO2 98 %.  Wt Readings from Last 3 Encounters:  08/27/22 243 lb 1.6 oz (110.3 kg)  05/30/22 239 lb (108.4 kg)  02/24/22 235 lb 4.8 oz (106.7 kg)    Body mass index is 38.07 kg/m.  Performance status (ECOG): 0 - Asymptomatic  PHYSICAL EXAM:  Physical Exam Vitals and nursing note reviewed.  Constitutional:      General: She is not in acute distress.    Appearance: Normal appearance. She is normal weight. She is not ill-appearing, toxic-appearing or diaphoretic.  HENT:     Head: Normocephalic and atraumatic.     Right Ear: Tympanic membrane, ear canal and external ear normal. There is no impacted cerumen.     Left Ear: Tympanic membrane, ear canal and external ear normal. There is no impacted cerumen.     Nose: Nose normal. No congestion or rhinorrhea.     Mouth/Throat:     Mouth: Mucous membranes are  moist.     Pharynx: Oropharynx is clear. No oropharyngeal exudate or posterior oropharyngeal erythema.  Eyes:     General: No scleral icterus.       Right eye: No discharge.        Left eye: No discharge.     Extraocular Movements: Extraocular movements intact.     Conjunctiva/sclera: Conjunctivae normal.     Pupils: Pupils are equal, round, and reactive to light.  Neck:     Vascular: No carotid bruit.  Cardiovascular:     Rate and Rhythm: Normal rate and regular rhythm.     Pulses: Normal pulses.     Heart sounds: Normal heart sounds. No murmur heard.    No friction rub. No gallop.  Pulmonary:     Effort: Pulmonary effort is normal. No respiratory distress.     Breath  sounds: Normal breath sounds. No stridor. No wheezing, rhonchi or rales.  Chest:     Chest wall: No tenderness.     Comments: Barley visible scar in the lower outer quadrant of the right breast. No masses in either breast. Abdominal:     General: Bowel sounds are normal. There is no distension.     Palpations: Abdomen is soft. There is no hepatomegaly, splenomegaly or mass.     Tenderness: There is no abdominal tenderness. There is no right CVA tenderness, left CVA tenderness, guarding or rebound.     Hernia: No hernia is present.  Musculoskeletal:        General: No swelling, tenderness, deformity or signs of injury. Normal range of motion.     Cervical back: Normal range of motion and neck supple. No rigidity or tenderness.     Right lower leg: No edema.     Left lower leg: No edema.     Comments: No spinal tenderness but slight scoliosis.   Lymphadenopathy:     Cervical: No cervical adenopathy.  Skin:    General: Skin is warm and dry.     Coloration: Skin is not jaundiced or pale.     Findings: No bruising, erythema, lesion or rash.  Neurological:     General: No focal deficit present.     Mental Status: She is alert and oriented to person, place, and time. Mental status is at baseline.     Cranial Nerves: No cranial nerve deficit.     Sensory: No sensory deficit.     Motor: No weakness.     Coordination: Coordination normal.     Gait: Gait normal.     Deep Tendon Reflexes: Reflexes normal.  Psychiatric:        Mood and Affect: Mood normal.        Behavior: Behavior normal.        Thought Content: Thought content normal.        Judgment: Judgment normal.     LABS:      Latest Ref Rng & Units 08/27/2022    9:28 AM 02/24/2022   12:00 AM 11/18/2021    9:42 AM  CBC  WBC 4.0 - 10.5 K/uL 3.1  5.6     3.4   Hemoglobin 12.0 - 15.0 g/dL 13.3  14.0     13.3   Hematocrit 36.0 - 46.0 % 41.3  42     39.4   Platelets 150 - 400 K/uL 190  174     182.0      This result is from an  external source.      Latest  Ref Rng & Units 08/27/2022    9:28 AM 02/24/2022   12:00 AM 11/18/2021    9:42 AM  CMP  Glucose 70 - 99 mg/dL 100   90   BUN 8 - 23 mg/dL 18  17     14   $ Creatinine 0.44 - 1.00 mg/dL 0.87  0.7     0.82   Sodium 135 - 145 mmol/L 140  136     141   Potassium 3.5 - 5.1 mmol/L 4.3  4.3     4.0   Chloride 98 - 111 mmol/L 103  104     105   CO2 22 - 32 mmol/L 28  27     29   $ Calcium 8.9 - 10.3 mg/dL 8.8  8.9     9.2   Total Protein 6.5 - 8.1 g/dL 6.8   6.8   Total Bilirubin 0.3 - 1.2 mg/dL 0.7   0.5   Alkaline Phos 38 - 126 U/L 68  87     85   AST 15 - 41 U/L 22  21     21   $ ALT 0 - 44 U/L 21  19     17      $ This result is from an external source.   Component Ref Range & Units 6 mo ago  Folate >5.9 ng/mL 10.8       Component Ref Range & Units 6 mo ago  Vitamin B-12 180 - 914 pg/mL 253      STUDIES:  No studies found  Allergies:  Allergies  Allergen Reactions   Cephalosporins Hives    Current Medications: Current Outpatient Medications  Medication Sig Dispense Refill   Cholecalciferol (VITAMIN D3) 10 MCG (400 UNIT) tablet Take 400 Units by mouth daily.     cyanocobalamin (VITAMIN B12) 500 MCG tablet Take 500 mcg by mouth daily.     FLUZONE HIGH-DOSE QUADRIVALENT 0.7 ML SUSY      Folic Acid-Vit Q000111Q 123456 (HOMOCYSTEINE FORMULA) 0.8-50-0.1 MG TABS      Magnesium 250 MG TABS Take by mouth.     meloxicam (MOBIC) 15 MG tablet Take 15 mg by mouth daily.     Misc Natural Products (GLUCOSAMINE CHOND MSM FORMULA PO) Take by mouth.     multivitamin-lutein (OCUVITE-LUTEIN) CAPS capsule Take 1 capsule by mouth daily.     Probiotic Product (PROBIOTIC PEARLS ADVANTAGE PO) Take by mouth.     No current facility-administered medications for this visit.     ASSESSMENT & PLAN:   Assessment:   1.  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  She has undergone lumpectomy with clear margins.  [facility] has not recommended further  surgical excision.  She will be undergoing MRI breast and CT imaging yearly now for close follow up. This is scheduled for March. Her annual mammograms are done in September with Dr. Lilia Pro.  2.  Strong family history of breast cancer. [facility] Multi Cancer Panel test was negative-no clinically significant mutation identified.  There was a variant of uncertain significance (VUS) found in the [facility] gene.   3.  History of melanoma in situ of the lower right calf, diagnosed by Dr. Marge Duncans, dermatology in [place].  This was treated with wide excision.  She follows up with dermatology routinely. She has a small keratosis of the right mid back where she previously ha dan excision of an lesion so we will monitor this.   4.  Multiple angiomyolipomas  of bilateral kidneys on CT scan.  There is also an indeterminate 15 mm lesion arising from the posterior right kidney.  This may represent a lipid poor angiomyolipoma, but she is followed regularly.  5.  Multiple benign lipomas of the skin, mainly involving the right upper extremity.  6.  Colon polyps and familial history of colon cancer.  She should have a repeat colonoscopy in the next 1-2 years.    Plan: She is scheduled for CT and MRI imaging in March at [facility]. Her last bone density scan was in 2020 and was normal, but I think should be repeated this year, I will schedule it and call her with the results. Her labs are pending today. I will see her in 6 months with CBC and CMP and will schedule her next bone density scan now.  She verbalizes understanding of and agreement to the plans discussed today. She knows to call the office should any new questions or concerns arise.   I provided 20 minutes of face-to-face time during this this encounter and > 50% was spent counseling as documented under my assessment and plan.    Derwood Kaplan, MD [place] [address] [place]  [address] Dept: [phone] Dept Fax: [phone]    Sumner Boast Lassiter,acting as a scribe for Derwood Kaplan, MD.,have documented all relevant documentation on the behalf of Derwood Kaplan, MD,as directed by  Derwood Kaplan, MD while in the presence of Derwood Kaplan, MD.

## 2022-08-27 NOTE — Telephone Encounter (Signed)
08/27/22 Next appt scheduled and confirmed with patient.Dexa scan scheduled on 09/18/22'@830am'$ 

## 2022-09-02 DIAGNOSIS — D709 Neutropenia, unspecified: Secondary | ICD-10-CM | POA: Insufficient documentation

## 2022-09-03 ENCOUNTER — Telehealth: Payer: Self-pay

## 2022-09-03 NOTE — Telephone Encounter (Signed)
Patient notified of results.

## 2022-09-03 NOTE — Telephone Encounter (Signed)
-----   Message from Derwood Kaplan, MD sent at 09/03/2022  7:23 AM EST ----- Regarding: RE: call Sorry, it is Kathy Joseph ----- Message ----- From: Belva Chimes, LPN Sent: 3/54/6568   2:01 PM EST To: Derwood Kaplan, MD Subject: RE: call                                       Who is the patient?  ----- Message ----- From: Derwood Kaplan, MD Sent: 09/02/2022  10:21 AM EST To: Belva Chimes, LPN Subject: call                                           Tell her labs look great except low WBC 3.1.  It did that last May and then came back to normal last time. 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 Her neutrophils (infection fighting cells) are adequate at 1.5 so she is not in any danger but would still be cautious to avoid infections. Will continue to monitor

## 2022-09-12 DIAGNOSIS — D2239 Melanocytic nevi of other parts of face: Secondary | ICD-10-CM | POA: Diagnosis not present

## 2022-09-12 DIAGNOSIS — D485 Neoplasm of uncertain behavior of skin: Secondary | ICD-10-CM | POA: Diagnosis not present

## 2022-09-12 DIAGNOSIS — D225 Melanocytic nevi of trunk: Secondary | ICD-10-CM | POA: Diagnosis not present

## 2022-09-12 DIAGNOSIS — Z85820 Personal history of malignant melanoma of skin: Secondary | ICD-10-CM | POA: Diagnosis not present

## 2022-09-16 ENCOUNTER — Ambulatory Visit (INDEPENDENT_AMBULATORY_CARE_PROVIDER_SITE_OTHER): Payer: PPO | Admitting: Family Medicine

## 2022-09-16 ENCOUNTER — Encounter: Payer: Self-pay | Admitting: Family Medicine

## 2022-09-16 VITALS — BP 119/78 | HR 65 | Temp 97.8°F | Ht 67.0 in | Wt 243.0 lb

## 2022-09-16 DIAGNOSIS — M1990 Unspecified osteoarthritis, unspecified site: Secondary | ICD-10-CM | POA: Diagnosis not present

## 2022-09-16 DIAGNOSIS — E785 Hyperlipidemia, unspecified: Secondary | ICD-10-CM | POA: Diagnosis not present

## 2022-09-16 DIAGNOSIS — E669 Obesity, unspecified: Secondary | ICD-10-CM

## 2022-09-16 MED ORDER — ZEPBOUND 5 MG/0.5ML ~~LOC~~ SOAJ: 5.0000 mg | SUBCUTANEOUS | 0 refills | Status: DC

## 2022-09-16 MED ORDER — ZEPBOUND 2.5 MG/0.5ML ~~LOC~~ SOAJ: 2.5000 mg | SUBCUTANEOUS | 0 refills | Status: DC

## 2022-09-16 NOTE — Progress Notes (Signed)
   Kathy Joseph is a 67 y.o. female who presents today for an office visit.  Assessment/Plan:  Chronic Problems Addressed Today: Obesity Patient with multiple comorbidities including osteoarthritis and dyslipidemia.  She has not had much success with weight loss with lifestyle interventions.  Would be reasonable for her to start GLP-1 agonist at this point.  We have discussed this in the past however insurance would not pay for it.  She would like to try again.  We did discuss potential side effects including nausea and constipation.  We will start Zepbound due to higher efficacy than semaglutide.  If this is cost prohibitive and insurance would not pay for this would also consider trial of Ozempic or Wegovy.  Will start low-dose 2.5 mg weekly for 4 weeks and then increase to 5 mg weekly.  She will send a message in MyChart in a few weeks on how she is doing and we can titrate the dose as needed.  Would consider referral to medical weight management if continues to have issues with weight especially in light of her comorbidities.  Arthritis Symptoms are controlled and she is following with orthopedics for this.  She has been told that she will need knee replacement at some point soon.  She is aware that losing weight will have significant benefit for her arthritis and that she will not be able to have surgery done until she is able to lose weight.  We are treating her obesity as above.  Dyslipidemia We will check lipids when she comes back in a few months for CPE.  We are treating her obesity as above.  She is not currently on statin.     Subjective:  HPI:  PAtient here for follow up.Since our last visit she has been following with orthopedics for knee pain and arthritis. She was told that she would probably need to have a replacement on her right knee.  She has been working hard on weight loss.  She was told she would need to lose weight before they would do her knee replacement  surgery.  She has been getting corticosteroid shots in her knee with some improvement.  She is working on diet and exercise however has not had much success with weight loss.       Objective:  Physical Exam: BP 119/78   Pulse 65   Temp 97.8 F (36.6 C) (Temporal)   Ht 5' 7"$  (1.702 m)   Wt 243 lb (110.2 kg)   SpO2 100%   BMI 38.06 kg/m   Wt Readings from Last 3 Encounters:  09/16/22 243 lb (110.2 kg)  08/27/22 243 lb 1.6 oz (110.3 kg)  05/30/22 239 lb (108.4 kg)    Gen: No acute distress, resting comfortably Neuro: Grossly normal, moves all extremities Psych: Normal affect and thought content      Teena Mangus M. Jerline Pain, MD 09/16/2022 9:52 AM

## 2022-09-16 NOTE — Assessment & Plan Note (Signed)
Patient with multiple comorbidities including osteoarthritis and dyslipidemia.  She has not had much success with weight loss with lifestyle interventions.  Would be reasonable for her to start GLP-1 agonist at this point.  We have discussed this in the past however insurance would not pay for it.  She would like to try again.  We did discuss potential side effects including nausea and constipation.  We will start Zepbound due to higher efficacy than semaglutide.  If this is cost prohibitive and insurance would not pay for this would also consider trial of Ozempic or Wegovy.  Will start low-dose 2.5 mg weekly for 4 weeks and then increase to 5 mg weekly.  She will send a message in MyChart in a few weeks on how she is doing and we can titrate the dose as needed.  Would consider referral to medical weight management if continues to have issues with weight especially in light of her comorbidities.

## 2022-09-16 NOTE — Assessment & Plan Note (Signed)
Symptoms are controlled and she is following with orthopedics for this.  She has been told that she will need knee replacement at some point soon.  She is aware that losing weight will have significant benefit for her arthritis and that she will not be able to have surgery done until she is able to lose weight.  We are treating her obesity as above.

## 2022-09-16 NOTE — Patient Instructions (Signed)
It was very nice to see you today!  We will start Zepbound. Please let me know how you are doing in a few weeks and we can increase the dose.  Take care, Dr Jerline Pain  PLEASE NOTE:  If you had any lab tests, please let us know if you have not heard back within a few days. You may see your results on mychart before we have a chance to review them but we will give you a call once they are reviewed by [name].   If we ordered any referrals today, please let us know if you have not heard from their office within the next week.   If you had any urgent prescriptions sent in today, please check with the pharmacy within an hour of our visit to make sure the prescription was transmitted appropriately.   Please try these tips to maintain a healthy lifestyle:  Eat at least 3 REAL meals and 1-2 snacks per day.  Aim for no more than 5 hours between eating.  If you eat breakfast, please do so within one hour of getting up.   Each meal should contain half fruits/vegetables, one quarter protein, and one quarter carbs (no bigger than a computer mouse)  Cut down on sweet beverages. This includes juice, soda, and sweet tea.   Drink at least 1 glass of water with each meal and aim for at least 8 glasses per day  Exercise at least 150 minutes every week.

## 2022-09-16 NOTE — Assessment & Plan Note (Signed)
We will check lipids when she comes back in a few months for CPE.  We are treating her obesity as above.  She is not currently on statin.

## 2022-09-17 ENCOUNTER — Ambulatory Visit: Payer: PPO | Admitting: Family Medicine

## 2022-09-18 DIAGNOSIS — M8589 Other specified disorders of bone density and structure, multiple sites: Secondary | ICD-10-CM | POA: Diagnosis not present

## 2022-09-18 DIAGNOSIS — M810 Age-related osteoporosis without current pathological fracture: Secondary | ICD-10-CM | POA: Diagnosis not present

## 2022-09-19 ENCOUNTER — Telehealth: Payer: Self-pay | Admitting: Family Medicine

## 2022-09-19 NOTE — Telephone Encounter (Signed)
.  Type of form received: Request for Prescription Coverage  Additional comments:   Received by: Sunnie Nielsen should be Faxed to:  Form should be mailed to:    Is patient requesting call for pickup: yes   Form placed:  In provider's box  Attach charge sheet. yes  Individual made aware of 3-5 business day turn around (Y/N)?  Yes

## 2022-09-22 NOTE — Telephone Encounter (Signed)
Form placed at PCP office to be reviewed

## 2022-09-24 DIAGNOSIS — M5388 Other specified dorsopathies, sacral and sacrococcygeal region: Secondary | ICD-10-CM | POA: Diagnosis not present

## 2022-09-24 DIAGNOSIS — M9904 Segmental and somatic dysfunction of sacral region: Secondary | ICD-10-CM | POA: Diagnosis not present

## 2022-09-24 DIAGNOSIS — M47815 Spondylosis without myelopathy or radiculopathy, thoracolumbar region: Secondary | ICD-10-CM | POA: Diagnosis not present

## 2022-09-24 DIAGNOSIS — M5431 Sciatica, right side: Secondary | ICD-10-CM | POA: Diagnosis not present

## 2022-09-24 DIAGNOSIS — M9902 Segmental and somatic dysfunction of thoracic region: Secondary | ICD-10-CM | POA: Diagnosis not present

## 2022-09-24 DIAGNOSIS — M9903 Segmental and somatic dysfunction of lumbar region: Secondary | ICD-10-CM | POA: Diagnosis not present

## 2022-09-26 ENCOUNTER — Encounter: Payer: Self-pay | Admitting: Oncology

## 2022-09-26 DIAGNOSIS — Z0279 Encounter for issue of other medical certificate: Secondary | ICD-10-CM

## 2022-09-29 DIAGNOSIS — M205X2 Other deformities of toe(s) (acquired), left foot: Secondary | ICD-10-CM | POA: Diagnosis not present

## 2022-09-29 DIAGNOSIS — L84 Corns and callosities: Secondary | ICD-10-CM | POA: Diagnosis not present

## 2022-09-29 DIAGNOSIS — M79675 Pain in left toe(s): Secondary | ICD-10-CM | POA: Diagnosis not present

## 2022-09-29 NOTE — Telephone Encounter (Signed)
Form ready to be pick up  Placed at front office  Copy placed to be scan on pt chart

## 2022-10-02 DIAGNOSIS — C50411 Malignant neoplasm of upper-outer quadrant of right female breast: Secondary | ICD-10-CM | POA: Diagnosis not present

## 2022-10-02 DIAGNOSIS — C50919 Malignant neoplasm of unspecified site of unspecified female breast: Secondary | ICD-10-CM | POA: Diagnosis not present

## 2022-10-02 DIAGNOSIS — C50911 Malignant neoplasm of unspecified site of right female breast: Secondary | ICD-10-CM | POA: Diagnosis not present

## 2022-10-02 DIAGNOSIS — D1771 Benign lipomatous neoplasm of kidney: Secondary | ICD-10-CM | POA: Diagnosis not present

## 2022-10-02 DIAGNOSIS — I728 Aneurysm of other specified arteries: Secondary | ICD-10-CM | POA: Diagnosis not present

## 2022-10-02 DIAGNOSIS — C499 Malignant neoplasm of connective and soft tissue, unspecified: Secondary | ICD-10-CM | POA: Diagnosis not present

## 2022-10-02 DIAGNOSIS — Z01419 Encounter for gynecological examination (general) (routine) without abnormal findings: Secondary | ICD-10-CM | POA: Diagnosis not present

## 2022-10-02 DIAGNOSIS — Z881 Allergy status to other antibiotic agents status: Secondary | ICD-10-CM | POA: Diagnosis not present

## 2022-10-02 DIAGNOSIS — R918 Other nonspecific abnormal finding of lung field: Secondary | ICD-10-CM | POA: Diagnosis not present

## 2022-10-02 DIAGNOSIS — Z1239 Encounter for other screening for malignant neoplasm of breast: Secondary | ICD-10-CM | POA: Diagnosis not present

## 2022-11-10 DIAGNOSIS — D1771 Benign lipomatous neoplasm of kidney: Secondary | ICD-10-CM | POA: Diagnosis not present

## 2022-11-10 DIAGNOSIS — R1900 Intra-abdominal and pelvic swelling, mass and lump, unspecified site: Secondary | ICD-10-CM | POA: Diagnosis not present

## 2022-11-19 ENCOUNTER — Encounter: Payer: Self-pay | Admitting: Family Medicine

## 2022-11-19 ENCOUNTER — Telehealth (INDEPENDENT_AMBULATORY_CARE_PROVIDER_SITE_OTHER): Payer: PPO | Admitting: Family Medicine

## 2022-11-19 VITALS — Ht 67.0 in | Wt 240.0 lb

## 2022-11-19 DIAGNOSIS — R1011 Right upper quadrant pain: Secondary | ICD-10-CM

## 2022-11-19 DIAGNOSIS — M810 Age-related osteoporosis without current pathological fracture: Secondary | ICD-10-CM

## 2022-11-19 DIAGNOSIS — D1771 Benign lipomatous neoplasm of kidney: Secondary | ICD-10-CM | POA: Diagnosis not present

## 2022-11-19 MED ORDER — PANTOPRAZOLE SODIUM 40 MG PO TBEC: 40.0000 mg | DELAYED_RELEASE_TABLET | Freq: Every day | ORAL | 3 refills | Status: DC

## 2022-11-19 NOTE — Progress Notes (Signed)
   Kathy Joseph is a 67 y.o. female who presents today for a virtual office visit.  Assessment/Plan:  New/Acute Problems: RUQ Abdominal Pain Discussed limitations of virtual visit and inability perform physical exam.  Recently had abdominal MRI which did not show any obvious gallstones or pancreatic issues.  She is having more postprandial symptoms.  Potential this could be PUD or gastritis.  We will empirically try Protonix 40 mg daily for a few weeks.  If not improving will need referral to GI for EGD.  We discussed reasons to return to care.  Chronic Problems Addressed Today: Osteoporosis Recent T-score -2.6.  We discussed results.  We discussed vitamin D and calcium.  She will take vitamin D supplementation.  We discussed foods rich in calcium but she would like to avoid calcium supplements for now.  Deferred bisphosphonates for now.  We did discuss referral to endocrinology however she declined.  We can recheck DEXA scan in 2 years.  Renal angiomyolipoma Stable on recent MRI.  Continue management per oncology.     Subjective:  HPI:  Patient here today for follow up.  She was last seen here a couple of months ago. Since our last visit she did see her oncologist and had DEXA scan. This showed a t score of -2.6. She is currently on vitamin D. She has had issues with calcium in the past and does not want to take any supplements. At this time.  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  These are stable in size compared to previous.  She has been following with oncology.  She is also been having ongoing issues with right upper quadrant pain.  This is been going on for quite but seems to change recently.  No nausea or vomiting.  Last for about 30 minutes and then subsides.  Usually happens after eating.  No melena or hematochezia.       Objective/Observations  Physical Exam: Gen: NAD, resting comfortably Pulm: Normal work of breathing Neuro: Grossly  normal, moves all extremities Psych: Normal affect and thought content  Virtual Visit via Video   I connected with Kathy Joseph on 11/19/22 at  9:40 AM EDT by a video enabled telemedicine application and verified that I am speaking with the correct person using two identifiers. The limitations of evaluation and management by telemedicine and the availability of in person appointments were discussed. The patient expressed understanding and agreed to proceed.   Patient location: Home Provider location: [place] [facility] [employer] [facility] Persons participating in the virtual visit: Myself and Patient     Katina Degree. [name], MD 11/19/2022 10:50 AM

## 2022-11-19 NOTE — Assessment & Plan Note (Signed)
Stable on recent MRI.  Continue management per oncology.

## 2022-11-19 NOTE — Assessment & Plan Note (Addendum)
Recent T-score -2.6.  We discussed results.  We discussed vitamin D and calcium.  She will take vitamin D supplementation.  We discussed foods rich in calcium but she would like to avoid calcium supplements for now.  Deferred bisphosphonates for now.  We did discuss referral to endocrinology however she declined.  We can recheck DEXA scan in 2 years.

## 2022-11-25 DIAGNOSIS — G8929 Other chronic pain: Secondary | ICD-10-CM | POA: Diagnosis not present

## 2022-11-25 DIAGNOSIS — C50911 Malignant neoplasm of unspecified site of right female breast: Secondary | ICD-10-CM | POA: Diagnosis not present

## 2022-11-25 DIAGNOSIS — M1711 Unilateral primary osteoarthritis, right knee: Secondary | ICD-10-CM | POA: Diagnosis not present

## 2022-11-25 DIAGNOSIS — M25561 Pain in right knee: Secondary | ICD-10-CM | POA: Diagnosis not present

## 2022-12-02 ENCOUNTER — Encounter: Payer: Self-pay | Admitting: Family Medicine

## 2022-12-02 NOTE — Telephone Encounter (Signed)
See note

## 2022-12-02 NOTE — Telephone Encounter (Signed)
I appreciate the update.  She should continue for full 6 weeks.  Please send a new prescription for protonix if needed.   She should let us know if symptoms return.

## 2022-12-03 ENCOUNTER — Other Ambulatory Visit: Payer: Self-pay | Admitting: *Deleted

## 2022-12-03 MED ORDER — ZEPBOUND 5 MG/0.5ML ~~LOC~~ SOAJ: 5.0000 mg | SUBCUTANEOUS | 0 refills | Status: DC

## 2022-12-09 DIAGNOSIS — M1711 Unilateral primary osteoarthritis, right knee: Secondary | ICD-10-CM | POA: Diagnosis not present

## 2022-12-09 DIAGNOSIS — G8929 Other chronic pain: Secondary | ICD-10-CM | POA: Diagnosis not present

## 2022-12-09 DIAGNOSIS — M25561 Pain in right knee: Secondary | ICD-10-CM | POA: Diagnosis not present

## 2022-12-10 DIAGNOSIS — G8929 Other chronic pain: Secondary | ICD-10-CM | POA: Diagnosis not present

## 2022-12-10 DIAGNOSIS — M1711 Unilateral primary osteoarthritis, right knee: Secondary | ICD-10-CM | POA: Diagnosis not present

## 2022-12-10 DIAGNOSIS — M25561 Pain in right knee: Secondary | ICD-10-CM | POA: Diagnosis not present

## 2022-12-27 ENCOUNTER — Encounter: Payer: Self-pay | Admitting: Family Medicine

## 2022-12-29 NOTE — Telephone Encounter (Signed)
Please advise 

## 2023-01-01 ENCOUNTER — Other Ambulatory Visit: Payer: Self-pay

## 2023-01-01 ENCOUNTER — Telehealth: Payer: Self-pay

## 2023-01-01 DIAGNOSIS — I8390 Asymptomatic varicose veins of unspecified lower extremity: Secondary | ICD-10-CM

## 2023-01-01 NOTE — Telephone Encounter (Signed)
Pt called to let us know she has an upcoming MD f/u for her one year surveillance and is having worsening BLE VV pain. She had been seen here about 2 years ago with a reflux study and was a possible candidate for LA. She has been scheduled for a reflux study next week, prior to her MD appt. Pt is aware.

## 2023-01-07 ENCOUNTER — Ambulatory Visit (HOSPITAL_COMMUNITY)
Admission: RE | Admit: 2023-01-07 | Discharge: 2023-01-07 | Disposition: A | Discharge status: Home / Self Care | Payer: PPO | Source: Ambulatory Visit | Attending: Vascular Surgery | Admitting: Vascular Surgery

## 2023-01-07 DIAGNOSIS — I8390 Asymptomatic varicose veins of unspecified lower extremity: Secondary | ICD-10-CM | POA: Diagnosis not present

## 2023-01-12 ENCOUNTER — Encounter: Payer: Self-pay | Admitting: Surgery

## 2023-01-12 ENCOUNTER — Ambulatory Visit: Payer: PPO | Admitting: Surgery

## 2023-01-12 VITALS — BP 120/78 | HR 71 | Temp 98.1°F | Resp 20 | Ht 67.0 in | Wt 234.0 lb

## 2023-01-12 DIAGNOSIS — I8390 Asymptomatic varicose veins of unspecified lower extremity: Secondary | ICD-10-CM | POA: Diagnosis not present

## 2023-01-12 DIAGNOSIS — I728 Aneurysm of other specified arteries: Secondary | ICD-10-CM | POA: Diagnosis not present

## 2023-01-12 NOTE — Progress Notes (Signed)
[facility]ist of [facility]  Patient name: Kathy Joseph MRN: [id number] DOB: 03-31-1956 Sex: female   REASON FOR VISIT:    Follow up  HISOTRY OF PRESENT ILLNESS:    Kathy Joseph is a 67 y.o. female who I am following for a splenic artery aneurysm that was detected on a follow-up CT scan for breast cancer at [facility].  This showed two 1.5 cm aneurysms in the splenic artery.  The patient has a history of melanoma in situ.  She also has bilateral varicose veins.  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  The patient began noticing swelling in 2022.  It is associated with prolonged sitting and standing.  She denies any history of DVT.  She denies any history of venous ulcers.  There is no family history of venous disease.  She began having back pain in May 2023.  This was treated with physical therapy.  She had intermittent right leg pain that was thought to be may be from sciatica.  Now, her right knee pain is severe and she is scheduled to have a right knee replacement in the latter part of 2024.  She is bothered by her pain and now it is waking her up at night.  It is in both legs and she describes it as aching up and down.  She just treat some ankle swelling with leg elevation.  She is also taking medication to help with weight loss.   PAST MEDICAL HISTORY:   Past Medical History:  Diagnosis Date   Arthritis    Atypical nevus 06/11/2018   below left ear, moderate atypia   Atypical nevus 06/11/2018   right mid forehead, moderate to severe (wider shave done 07/08/2018)   Melanoma (HCC) 01/20/2019   melanoma in situ on right distal calf TX on 07/29/202   Varicose veins of lower extremity      FAMILY HISTORY:   Family History  Problem Relation Age of Onset   Breast cancer Mother    Colon cancer Father        Diagnosed in 71s.    Breast cancer Sister    Breast cancer Paternal Aunt    Breast cancer  Maternal Aunt     SOCIAL HISTORY:   Social History   Tobacco Use   Smoking status: Never    Passive exposure: Never   Smokeless tobacco: Never  Substance Use Topics   Alcohol use: Yes    Alcohol/week: 3.0 standard drinks of alcohol    Types: 3 Glasses of wine per week     ALLERGIES:   Allergies  Allergen Reactions   Cephalosporins Hives     CURRENT MEDICATIONS:   Current Outpatient Medications  Medication Sig Dispense Refill   Cholecalciferol (VITAMIN D3) 10 MCG (400 UNIT) tablet Take 400 Units by mouth daily.     cyanocobalamin (VITAMIN B12) 500 MCG tablet Take 500 mcg by mouth daily.     Folic Acid-Vit B6-Vit B12 (HOMOCYSTEINE FORMULA) 0.8-50-0.1 MG TABS      Magnesium 250 MG TABS Take by mouth.     meloxicam (MOBIC) 15 MG tablet Take 15 mg by mouth daily.     Misc Natural Products (GLUCOSAMINE CHOND MSM FORMULA PO) Take by mouth.     multivitamin-lutein (OCUVITE-LUTEIN) CAPS capsule Take 1 capsule by mouth daily.     pantoprazole (PROTONIX) 40 MG tablet Take 1 tablet (40 mg total) by mouth daily. 30 tablet 3   Probiotic Product (PROBIOTIC PEARLS ADVANTAGE PO)  Take by mouth.     tirzepatide (ZEPBOUND) 5 MG/0.5ML Pen Inject 5 mg into the skin once a week. 2 mL 0   No current facility-administered medications for this visit.    REVIEW OF SYSTEMS:   [X]  denotes positive finding, [ ]  denotes negative finding Cardiac  Comments:  Chest pain or chest pressure:    Shortness of breath upon exertion:    Short of breath when lying flat:    Irregular heart rhythm:        Vascular    Pain in calf, thigh, or hip brought on by ambulation:    Pain in feet at night that wakes you up from your sleep:     Blood clot in your veins:    Leg swelling:  x       Pulmonary    Oxygen at home:    Productive cough:     Wheezing:         Neurologic    Sudden weakness in arms or legs:     Sudden numbness in arms or legs:     Sudden onset of difficulty speaking or slurred speech:     Temporary loss of vision in one eye:     Problems with dizziness:         Gastrointestinal    Blood in stool:     Vomited blood:         Genitourinary    Burning when urinating:     Blood in urine:        Psychiatric    Major depression:         Hematologic    Bleeding problems:    Problems with blood clotting too easily:        Skin    Rashes or ulcers:        Constitutional    Fever or chills:      PHYSICAL EXAM:   Vitals:   01/12/23 1439  BP: 120/78  Pulse: 71  Resp: 20  Temp: 98.1 F (36.7 C)  SpO2: 98%  Weight: 234 lb (106.1 kg)  Height: 5\' 7"  (1.702 m)    GENERAL: The patient is a well-nourished female, in no acute distress. The vital signs are documented above. CARDIAC: There is a regular rate and rhythm.  VASCULAR: SonoSite was used to evaluate the saphenous veins bilaterally.  The left side appears to be amenable to laser ablation the right side appears to be small in caliber. PULMONARY: Non-labored respirations ABDOMEN: Soft and non-tender with normal pitched bowel sounds.  MUSCULOSKELETAL: There are no major deformities or cyanosis. NEUROLOGIC: No focal weakness or paresthesias are detected. SKIN: There are no ulcers or rashes noted. PSYCHIATRIC: The patient has a normal affect.  STUDIES:   I have reviewed the following: Venous reflux study: RIGHT         Reflux NoRefluxReflux TimeDiameter cmsComments                          Yes                                   +--------------+---------+------+-----------+------------+--------+  CFV                    yes   >1 second                       +--------------+---------+------+-----------+------------+--------+  FV mid        no                                              +--------------+---------+------+-----------+------------+--------+  Popliteal    no                                               +--------------+---------+------+-----------+------------+--------+  GSV at SFJ              yes    >500 ms      0.69              +--------------+---------+------+-----------+------------+--------+  GSV prox thigh          yes    >500 ms      0.52              +--------------+---------+------+-----------+------------+--------+  GSV mid thigh           yes    >500 ms      0.41              +--------------+---------+------+-----------+------------+--------+  GSV dist thighno                            0.44              +--------------+---------+------+-----------+------------+--------+  GSV at knee   no                            0.52              +--------------+---------+------+-----------+------------+--------+  GSV prox calf no                            0.35              +--------------+---------+------+-----------+------------+--------+  GSV mid calf            yes    >500 ms      0.23              +--------------+---------+------+-----------+------------+--------+  SSV Pop Fossa no                                              +--------------+---------+------+-----------+------------+--------+  SSV prox calf           yes    >500 ms      0.30              +--------------+---------+------+-----------+------------+--------+  SSV mid calf            yes    >500 ms      0.30              +--------------+---------+------+-----------+------------+--------+  AASV                                               NV        +--------------+---------+------+-----------+------------+--------+     +--------------+---------+------+-----------+------------+--------+  LEFT         Reflux NoRefluxReflux TimeDiameter cmsComments                          Yes                                   +--------------+---------+------+-----------+------------+--------+  CFV                    yes   >1 second                        +--------------+---------+------+-----------+------------+--------+  FV mid        no                                              +--------------+---------+------+-----------+------------+--------+  Popliteal    no                                              +--------------+---------+------+-----------+------------+--------+  GSV at SFJ              yes    >500 ms      0.66              +--------------+---------+------+-----------+------------+--------+  GSV prox thigh          yes    >500 ms      0.68              +--------------+---------+------+-----------+------------+--------+  GSV mid thigh           yes    >500 ms      0.47              +--------------+---------+------+-----------+------------+--------+  GSV dist thigh          yes    >500 ms      0.48              +--------------+---------+------+-----------+------------+--------+  GSV at knee             yes    >500 ms      0.60              +--------------+---------+------+-----------+------------+--------+  GSV prox calf           yes    >500 ms      0.49              +--------------+---------+------+-----------+------------+--------+  GSV mid calf            yes    >500 ms      0.41              +--------------+---------+------+-----------+------------+--------+  SSV Pop Fossa no                            0.19              +--------------+---------+------+-----------+------------+--------+  SSV prox calf no                            0.24              +--------------+---------+------+-----------+------------+--------+  SSV mid calf  no                            0.21              +--------------+---------+------+-----------+------------+--------+  AASV o                                              NV         MEDICAL ISSUES:   Bilateral venous insufficiency: The left is worse than the right.  The patient is only having  ankle swelling currently.  She has multiple issues to explain her leg pain including her right knee and her lower back.  I told her that I did not think that laser ablation of her saphenous vein would alleviate her main reason for coming in having this evaluated.  I told her that I think it would help with her swelling but she could get equal results just from compression socks.  She is in agreement that we will continue to monitor her venous reflux with no plans for intervention currently  Splenic aneurysm: The patient splenic aneurysms remained stable in size.  She had a CT scan done at [facility] that shows maximum diameter of 1.8 cm.  She is scheduled to get a repeat CT scan in 1 year.  I will have her follow-up with me after she gets it.    Charlena Cross, MD, FACS [facility] [phone] Pager [phone]

## 2023-02-06 ENCOUNTER — Encounter: Payer: Self-pay | Admitting: Family Medicine

## 2023-02-06 MED ORDER — ZEPBOUND 7.5 MG/0.5ML ~~LOC~~ SOAJ: 7.5000 mg | SUBCUTANEOUS | 0 refills | Status: DC

## 2023-02-24 NOTE — Progress Notes (Signed)
[facility]  [address] [place],  [place]  [address] [phone]  Clinic Day:  02/25/23   Referring physician: Ardith Dark, MD   CHIEF COMPLAINT:  CC: History of mammary angiosarcoma   Current Treatment:  Surveillance   HISTORY OF PRESENT ILLNESS:  Kathy Joseph is a 67 y.o. female with mammary angiosarcoma of the right breast, diagnosed in August 2021.  This was found on a screening mammogram in November 2020, where she was found to have possible asymmetry in the right breast.  Diagnostic right mammogram and ultrasound revealed the asymmetry to persist on additional views, but ultrasound did not reveal any suspicious masses.  There were areas of focal fibroglandular tissue that may correspond to the abnormality.  Six-month follow-up right diagnostic mammogram and ultrasound were recommended.  In May 2021, there was an increase in the focal asymmetry in the right outer breast.  Ultrasound revealed an irregular mass at 8 o'clock 6 cm from the nipple measuring 1.9 x 1.7 x 1.1 cm.  No enlarged axillary lymph nodes were seen.  Biopsy revealed an angiolipoma with a differential diagnosis of fat necrosis versus malignancy.  The radiologist felt the findings were concordant, but as he was still concerned about the appearance on ultrasound, excision was recommended, so she was referred to Dr. Lequita Halt.  She underwent needle localized lumpectomy on August 21st and surgical pathology from this procedure revealed mammary angiosarcoma.  These results were sent to [facility], and confirmed.  Since even low grade lesions of this type have a substantial risk of distant metastasis, tumors of this type are generally treated by mastectomy.  CT chest from September 1st revealed no evidence of metastatic disease in the chest.  Bilateral calcified pulmonary nodules are consistent with prior granulomatous disease.  There is a 9 mm subpleural nodule in the left upper lobe with  curvilinear calcifications.  Regardless this appears benign.  Multiple fat density lesions involving both kidneys are consistent with multiple angiomyolipomas.  There is also an indeterminate 15 mm lesion arising from the posterior right kidney, which may represent a lipid poor angiomyolipoma.  Further characterization with renal protocol MRI was recommended.  Hypodense lesion in the left lobe of the thyroid gland measuring 12 mm.  Given size, this is not clinically significant.  She has been seen at [facility] in consultation and will be returning for their recommendations.  She started menarche at age 35.  She has had 4 pregnancies, with 3 live births.  Her 1st live birth was at age 56.  She was placed on tamoxifen as chemoprevention for 2-3 years, but this was discontinued after she developed a superficial venous thrombosis of the left leg.  She took oral contraceptives for 9 years after her last child.  The professors at [facility] reviewed the surgical pathology from her procedure, and they did not recommend any further excision at this time as her margins were clear.  The closest margin was 1.5 mm.  They did recommend close follow up with breast MRI and CT imaging every 6 months for the next 2 years. CT and MRI imaging from March 2022 at [facility] are benign.   INTERVAL HISTORY:  Kathy Joseph is here for routine follow up for history of mammary angiosarcoma.  She was also found to have a splenic aneurysm. She also continues follow up with Dr. Lequita Halt.She had a herniated disk 18 years ago and now has that again, with chronic pain. Patient states that she feels well  and has complains of pain of both knees, in need of surgery. She had an MRI at [facility] in April, 2024 and she has multiple bilateral renal lesion compatible with angiomyolipomas with the largest 1.7 cm in diameter and these are unchanged since 2021, as well as a stable splenic artery aneurysm up to 1,7 cm. She had a CT abdomen in March which was stable, a CT  chest in March was stable with a 9 mm left thyroid nodule, with no lymph nodes, and the haziness in both upper lobes is fading. Her mammogram is scheduled in October at [facility]. She has a knee replacement surgery coming up and she has been using Zepbound to help lose weight and she has lost 20 lbs. She asks if she could use iron supplement because of the medication and because she feels tired most times. We will check her iron levels to know if she is anemic. She has cyclic neutropenia and I counseled her that it is a limited benign condition and she should be careful when she gets an infection as it might take longer to recover. I reviewed her DEXA which was done 09/18/2022 with her and she has a T score of -2.6 in her forearms, so osteoporosis. She takes Vitamin D pills but does not tolerate calcium pills. She tried to incorporate calcium containing foods in her diet. I added TSH and Vitamin B12 level to her labs today. I will see her  back in 6 months with CBC and CMP.  She  denies signs of infections such as sore throat, sinus drainage, cough or urinary symptoms. She  denies fever or recurrent chills. She  also deny nausea, vomiting, chest pain, or dyspnea. Her  appetite is good and Her  weight has decreased 15 pounds over last 3 month     Review of Systems  Constitutional: Negative.  Negative for appetite change, chills, diaphoresis, fatigue, fever and unexpected weight change.  HENT:  Negative.  Negative for hearing loss, lump/mass, mouth sores, nosebleeds, sore throat, tinnitus, trouble swallowing and voice change.   Eyes: Negative.  Negative for eye problems and icterus.  Respiratory: Negative.  Negative for chest tightness, cough, hemoptysis, shortness of breath and wheezing.   Cardiovascular: Negative.  Negative for chest pain, leg swelling and palpitations.  Gastrointestinal: Negative.  Negative for abdominal distention, abdominal pain, blood in stool, constipation, diarrhea, nausea, rectal pain and  vomiting.  Endocrine: Negative.   Genitourinary: Negative.  Negative for bladder incontinence, difficulty urinating, dyspareunia, dysuria, frequency, hematuria, menstrual problem, nocturia, pelvic pain, vaginal bleeding and vaginal discharge.   Musculoskeletal:  Positive for arthralgias. Negative for back pain, flank pain, gait problem, myalgias, neck pain and neck stiffness.       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    Skin: Negative.  Negative for itching, rash and wound.  Neurological: Negative.  Negative for dizziness, extremity weakness, gait problem, headaches, light-headedness, numbness, seizures and speech difficulty.  Hematological: Negative.  Negative for adenopathy. Does not bruise/bleed easily.  Psychiatric/Behavioral: Negative.  Negative for confusion, decreased concentration, depression, sleep disturbance and suicidal ideas. The patient is not nervous/anxious.      VITALS:  Blood pressure 113/65, pulse 65, temperature (!) 97.5 F (36.4 C), temperature source Oral, resp. rate 16, height 5\' 7"  (1.702 m), weight 225 lb 14.4 oz (102.5 kg), SpO2 100%.  Wt Readings from Last 3 Encounters:  02/25/23 225 lb 14.4 oz (102.5 kg)  01/12/23 234 lb (106.1 kg)  11/19/22 240 lb (108.9 kg)  Body mass index is 35.38 kg/m.  Performance status (ECOG): 0 - Asymptomatic  PHYSICAL EXAM:  Physical Exam Vitals and nursing note reviewed.  Constitutional:      General: She is not in acute distress.    Appearance: Normal appearance. She is normal weight. She is not ill-appearing, toxic-appearing or diaphoretic.  HENT:     Head: Normocephalic and atraumatic.     Right Ear: Tympanic membrane, ear canal and external ear normal. There is no impacted cerumen.     Left Ear: Tympanic membrane, ear canal and external ear normal. There is no impacted cerumen.     Nose: Nose normal. No congestion or rhinorrhea.     Mouth/Throat:     Mouth: Mucous membranes are moist.     Pharynx: Oropharynx is clear. No  oropharyngeal exudate or posterior oropharyngeal erythema.  Eyes:     General: No scleral icterus.       Right eye: No discharge.        Left eye: No discharge.     Extraocular Movements: Extraocular movements intact.     Conjunctiva/sclera: Conjunctivae normal.     Pupils: Pupils are equal, round, and reactive to light.  Neck:     Vascular: No carotid bruit.  Cardiovascular:     Rate and Rhythm: Normal rate and regular rhythm.     Pulses: Normal pulses.     Heart sounds: Normal heart sounds. No murmur heard.    No friction rub. No gallop.  Pulmonary:     Effort: Pulmonary effort is normal. No respiratory distress.     Breath sounds: Normal breath sounds. No stridor. No wheezing, rhonchi or rales.  Chest:     Chest wall: No tenderness.     Comments: She has a well healed scar in the lower outer quadrant of the right breast. No masses in either breast.  Abdominal:     General: Bowel sounds are normal. There is no distension.     Palpations: Abdomen is soft. There is no hepatomegaly, splenomegaly or mass.     Tenderness: There is no abdominal tenderness. There is no right CVA tenderness, left CVA tenderness, guarding or rebound.     Hernia: No hernia is present.  Musculoskeletal:        General: No swelling, tenderness, deformity or signs of injury. Normal range of motion.     Cervical back: Normal range of motion and neck supple. No rigidity or tenderness.     Right lower leg: No edema.     Left lower leg: No edema.     Comments: No spinal tenderness but slight scoliosis.   Lymphadenopathy:     Cervical: No cervical adenopathy.  Skin:    General: Skin is warm and dry.     Coloration: Skin is not jaundiced or pale.     Findings: No bruising, erythema, lesion or rash.  Neurological:     General: No focal deficit present.     Mental Status: She is alert and oriented to person, place, and time. Mental status is at baseline.     Cranial Nerves: No cranial nerve deficit.      Sensory: No sensory deficit.     Motor: No weakness.     Coordination: Coordination normal.     Gait: Gait normal.     Deep Tendon Reflexes: Reflexes normal.  Psychiatric:        Mood and Affect: Mood normal.        Behavior: Behavior normal.  Thought Content: Thought content normal.        Judgment: Judgment normal.     LABS:      Latest Ref Rng & Units 02/25/2023    9:05 AM 08/27/2022    9:28 AM 02/24/2022   12:00 AM  CBC  WBC 4.0 - 10.5 K/uL 3.9  3.1  5.6      Hemoglobin 12.0 - 15.0 g/dL 16.1  09.6  04.5      Hematocrit 36.0 - 46.0 % 42.9  41.3  42      Platelets 150 - 400 K/uL 179  190  174         This result is from an external source.      Latest Ref Rng & Units 02/25/2023    9:05 AM 08/27/2022    9:28 AM 02/24/2022   12:00 AM  CMP  Glucose 70 - 99 mg/dL 92  409    BUN 8 - 23 mg/dL 18  18  17       Creatinine 0.44 - 1.00 mg/dL 8.11  9.14  0.7      Sodium 135 - 145 mmol/L 138  140  136      Potassium 3.5 - 5.1 mmol/L 4.1  4.3  4.3      Chloride 98 - 111 mmol/L 103  103  104      CO2 22 - 32 mmol/L 27  28  27       Calcium 8.9 - 10.3 mg/dL 9.3  8.8  8.9      Total Protein 6.5 - 8.1 g/dL 7.0  6.8    Total Bilirubin 0.3 - 1.2 mg/dL 0.6  0.7    Alkaline Phos 38 - 126 U/L 67  68  87      AST 15 - 41 U/L 18  22  21       ALT 0 - 44 U/L 18  21  19          This result is from an external source.   Component Ref Range & Units 6 mo ago  Folate >5.9 ng/mL 10.8       Component Ref Range & Units 6 mo ago  Vitamin B-12 180 - 914 pg/mL 253      STUDIES:  No studies found     Allergies:  Allergies  Allergen Reactions   Cephalosporins Hives, Itching, Rash and Swelling    Current Medications: Current Outpatient Medications  Medication Sig Dispense Refill   Cholecalciferol (VITAMIN D3) 10 MCG (400 UNIT) tablet Take 400 Units by mouth daily.     cyanocobalamin (VITAMIN B12) 500 MCG tablet Take 500 mcg by mouth daily.     Folic Acid-Vit B6-Vit B12 (HOMOCYSTEINE  FORMULA) 0.8-50-0.1 MG TABS      Magnesium 250 MG TABS Take by mouth.     meloxicam (MOBIC) 15 MG tablet Take 15 mg by mouth daily.     Misc Natural Products (GLUCOSAMINE CHOND MSM FORMULA PO) Take by mouth.     multivitamin-lutein (OCUVITE-LUTEIN) CAPS capsule Take 1 capsule by mouth daily.     pantoprazole (PROTONIX) 40 MG tablet Take 1 tablet (40 mg total) by mouth daily. 30 tablet 3   Probiotic Product (PROBIOTIC PEARLS ADVANTAGE PO) Take by mouth.     tirzepatide (ZEPBOUND) 10 MG/0.5ML Pen Inject 10 mg into the skin once a week. 2 mL 0   No current facility-administered medications for this visit.     ASSESSMENT & PLAN:   Assessment:  1.  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  She has undergone lumpectomy with clear margins.  [facility] has not recommended further surgical excision.  She will be undergoing MRI breast and CT imaging yearly now for close follow up. This was done in March and was stable. Her annual mammograms will be due in October with Dr. Lequita Halt.  2.  Strong family history of breast cancer. [facility] Multi Cancer Panel test was negative-no clinically significant mutation identified.  There was a variant of uncertain significance (VUS) found in the [facility] gene.   3.  History of melanoma in situ of the lower right calf, diagnosed by Dr. Jason Nest, dermatology in [place].  This was treated with wide excision.  She follows up with dermatology routinely. She has a small keratosis of the right mid back where she previously ha dan excision of an lesion so we will monitor this.   4.  Multiple angiomyolipomas of bilateral kidneys on CT scan.  There is also an indeterminate 15 mm lesion arising from the posterior right kidney.  This may represent a lipid poor angiomyolipoma, but she is followed regularly with scans at [facility].  5.  Multiple benign lipomas of the skin, mainly involving the right upper extremity.  6.  Colon polyps and familial history  of colon cancer.  She should have a repeat colonoscopy in the next 1-2 years.    Plan:  She had an MRI at [facility] in April, 2024 and she has multiple bilateral renal lesion compatible with angiomyolipomas with the largest 1.7 cm in diameter and these are unchanged since 2021, with a stable splenic artery aneurysm up to 1,7 cm. She had a CT abdomen in March which was stable, a CT chest in March was stable with a 9 mm left thyroid nodule, and with no lymph nodes, and the haziness in both upper lobes is fading. Her mammogram is scheduled in October by Dr. Lequita Halt. She has a knee replacement surgery coming up and she has been using Zepbound to help lose weight and she has lost 20 lbs. She asks if she could use iron supplement because of the medication and because she feels tired most times. We will check her iron levels to know if she is anemic. She has cyclic neutropenia and I counseled her that it is a limited benign condition and she should be careful when she gets an infection as it might take longer to recover. I reviewed her DEXA which was done 09/18/2022 with her and she has a T score of -2.6 in her forearms, so osteoporosis. She takes Vitamin D pills but does not tolerate calcium pills. She tried to incorporate calcium containing foods in her diet. I added TSH and Vitamin B 12 level to her labs today. I will see her  back in 6 months with CBC and CMP.  She verbalizes understanding of and agreement to the plans discussed today. She knows to call the office should any new questions or concerns arise.   I provided 25 minutes of face-to-face time during this this encounter and > 50% was spent counseling as documented under my assessment and plan.    Dellia Beckwith, MD [facility] [address] [place] [address] Dept: [phone] Dept Fax: [phone]     I,Oluwatobi Asade,acting as a scribe for Dellia Beckwith, MD.,have  documented all relevant documentation on the behalf of Dellia Beckwith, MD,as directed by  Dellia Beckwith, MD while in the presence of Dellia Beckwith, MD.

## 2023-02-25 ENCOUNTER — Encounter: Payer: Self-pay | Admitting: Oncology

## 2023-02-25 ENCOUNTER — Inpatient Hospital Stay: Payer: PPO

## 2023-02-25 ENCOUNTER — Inpatient Hospital Stay: Payer: PPO | Attending: Oncology | Admitting: Oncology

## 2023-02-25 ENCOUNTER — Other Ambulatory Visit: Payer: Self-pay | Admitting: Oncology

## 2023-02-25 ENCOUNTER — Telehealth: Payer: Self-pay | Admitting: Oncology

## 2023-02-25 ENCOUNTER — Other Ambulatory Visit: Payer: Self-pay

## 2023-02-25 VITALS — BP 113/65 | HR 65 | Temp 97.5°F | Resp 16 | Ht 67.0 in | Wt 225.9 lb

## 2023-02-25 DIAGNOSIS — N289 Disorder of kidney and ureter, unspecified: Secondary | ICD-10-CM | POA: Insufficient documentation

## 2023-02-25 DIAGNOSIS — Z79899 Other long term (current) drug therapy: Secondary | ICD-10-CM | POA: Insufficient documentation

## 2023-02-25 DIAGNOSIS — C50919 Malignant neoplasm of unspecified site of unspecified female breast: Secondary | ICD-10-CM | POA: Diagnosis not present

## 2023-02-25 DIAGNOSIS — Z803 Family history of malignant neoplasm of breast: Secondary | ICD-10-CM | POA: Insufficient documentation

## 2023-02-25 DIAGNOSIS — Z800 Family history of malignant neoplasm of digestive organs: Secondary | ICD-10-CM | POA: Diagnosis not present

## 2023-02-25 DIAGNOSIS — C499 Malignant neoplasm of connective and soft tissue, unspecified: Secondary | ICD-10-CM | POA: Insufficient documentation

## 2023-02-25 DIAGNOSIS — D704 Cyclic neutropenia: Secondary | ICD-10-CM | POA: Diagnosis not present

## 2023-02-25 DIAGNOSIS — R5383 Other fatigue: Secondary | ICD-10-CM

## 2023-02-25 DIAGNOSIS — Z85820 Personal history of malignant melanoma of skin: Secondary | ICD-10-CM | POA: Insufficient documentation

## 2023-02-25 LAB — CBC WITH DIFFERENTIAL (CANCER CENTER ONLY)
Abs Immature Granulocytes: 0 10*3/uL (ref 0.00–0.07)
Basophils Absolute: 0.1 10*3/uL (ref 0.0–0.1)
Basophils Relative: 1 %
Eosinophils Absolute: 0.2 10*3/uL (ref 0.0–0.5)
Eosinophils Relative: 5 %
HCT: 42.9 % (ref 36.0–46.0)
Hemoglobin: 14 g/dL (ref 12.0–15.0)
Immature Granulocytes: 0 %
Lymphocytes Relative: 35 %
Lymphs Abs: 1.4 10*3/uL (ref 0.7–4.0)
MCH: 31.2 pg (ref 26.0–34.0)
MCHC: 32.6 g/dL (ref 30.0–36.0)
MCV: 95.5 fL (ref 80.0–100.0)
Monocytes Absolute: 0.3 10*3/uL (ref 0.1–1.0)
Monocytes Relative: 9 %
Neutro Abs: 1.9 10*3/uL (ref 1.7–7.7)
Neutrophils Relative %: 50 %
Platelet Count: 179 10*3/uL (ref 150–400)
RBC: 4.49 MIL/uL (ref 3.87–5.11)
RDW: 11.4 % — ABNORMAL LOW (ref 11.5–15.5)
WBC Count: 3.9 10*3/uL — ABNORMAL LOW (ref 4.0–10.5)
nRBC: 0 % (ref 0.0–0.2)

## 2023-02-25 LAB — CMP (CANCER CENTER ONLY)
ALT: 18 U/L (ref 0–44)
AST: 18 U/L (ref 15–41)
Albumin: 4 g/dL (ref 3.5–5.0)
Alkaline Phosphatase: 67 U/L (ref 38–126)
Anion gap: 8 (ref 5–15)
BUN: 18 mg/dL (ref 8–23)
CO2: 27 mmol/L (ref 22–32)
Calcium: 9.3 mg/dL (ref 8.9–10.3)
Chloride: 103 mmol/L (ref 98–111)
Creatinine: 0.77 mg/dL (ref 0.44–1.00)
GFR, Estimated: >60 mL/min (ref >60)
Glucose, Bld: 92 mg/dL (ref 70–99)
Potassium: 4.1 mmol/L (ref 3.5–5.1)
Sodium: 138 mmol/L (ref 135–145)
Total Bilirubin: 0.6 mg/dL (ref 0.3–1.2)
Total Protein: 7 g/dL (ref 6.5–8.1)

## 2023-02-25 LAB — TSH: TSH: 2.03 u[IU]/mL (ref 0.350–4.500)

## 2023-02-25 LAB — VITAMIN B12: Vitamin B-12: 548 pg/mL (ref 180–914)

## 2023-02-25 NOTE — Telephone Encounter (Signed)
Patient has been scheduled. Aware of appt date and time    Scheduling Message Entered by Gery Pray H on 02/25/2023 at 10:08 AM Priority: Routine <No visit type provided>  Department: [facility]  Provider:  Scheduling Notes:  RT 6 months with labs

## 2023-03-04 ENCOUNTER — Telehealth: Payer: Self-pay

## 2023-03-04 NOTE — Telephone Encounter (Signed)
-----   Message from Dellia Beckwith sent at 02/26/2023  5:24 PM EDT ----- Regarding: call Tell her labs look good, incl thyroid and B12 level.  WBC nearly normal at 3.9

## 2023-03-04 NOTE — Telephone Encounter (Signed)
Left patient a message on identifiable voicemail.

## 2023-03-05 ENCOUNTER — Encounter (INDEPENDENT_AMBULATORY_CARE_PROVIDER_SITE_OTHER): Payer: Self-pay

## 2023-03-09 ENCOUNTER — Telehealth: Payer: Self-pay

## 2023-03-09 NOTE — Telephone Encounter (Signed)
-----   Message from Dellia Beckwith sent at 02/25/2023 12:33 PM EDT ----- Regarding: call Tell her WBC sl low at 3.9, but hgb 14.0 so she is definitely not low on iron.  The rest all looks good incl calcium I will let her know later if problem with B12 or thyroid

## 2023-03-09 NOTE — Telephone Encounter (Signed)
Patient notified of lab results

## 2023-03-09 NOTE — Telephone Encounter (Signed)
Attempted to contact patient. No answer. 

## 2023-03-12 ENCOUNTER — Encounter: Payer: Self-pay | Admitting: Family Medicine

## 2023-03-12 MED ORDER — ZEPBOUND 10 MG/0.5ML ~~LOC~~ SOAJ: 10.0000 mg | SUBCUTANEOUS | 0 refills | Status: DC

## 2023-03-19 DIAGNOSIS — C50411 Malignant neoplasm of upper-outer quadrant of right female breast: Secondary | ICD-10-CM | POA: Diagnosis not present

## 2023-03-19 DIAGNOSIS — Z853 Personal history of malignant neoplasm of breast: Secondary | ICD-10-CM | POA: Diagnosis not present

## 2023-03-24 NOTE — Telephone Encounter (Signed)
It is ok to go back to 7.5 mg weekly for another month. Please send in new prescription if needed.  Katina Degree. [name], MD 03/24/2023 2:38 PM

## 2023-03-24 NOTE — Telephone Encounter (Signed)
Please advise 

## 2023-03-25 ENCOUNTER — Other Ambulatory Visit: Payer: Self-pay | Admitting: *Deleted

## 2023-03-25 MED ORDER — ZEPBOUND 7.5 MG/0.5ML ~~LOC~~ SOAJ: 7.5000 mg | SUBCUTANEOUS | 0 refills | Status: DC

## 2023-03-25 NOTE — Telephone Encounter (Signed)
LVM It is ok to go back to 7.5 mg weekly for another month. I  send in new prescription to her pharmacy

## 2023-04-01 DIAGNOSIS — H25813 Combined forms of age-related cataract, bilateral: Secondary | ICD-10-CM | POA: Diagnosis not present

## 2023-04-02 ENCOUNTER — Ambulatory Visit (INDEPENDENT_AMBULATORY_CARE_PROVIDER_SITE_OTHER): Payer: PPO

## 2023-04-02 VITALS — Wt 225.0 lb

## 2023-04-02 DIAGNOSIS — Z0000 Encounter for general adult medical examination without abnormal findings: Secondary | ICD-10-CM

## 2023-04-02 NOTE — Progress Notes (Signed)
Subjective:   Kathy Joseph is a 67 y.o. female who presents for Medicare Annual (Subsequent) preventive examination.  Visit Complete: Virtual  I connected with  Kathy Joseph on 04/02/23 by a audio enabled telemedicine application and verified that I am speaking with the correct person using two identifiers.  Patient Location: Home  Provider Location: Office/Clinic  I discussed the limitations of evaluation and management by telemedicine. The patient expressed understanding and agreed to proceed.  Patient Medicare AWV questionnaire was completed by the patient on 03/30/23; I have confirmed that all information answered by patient is correct and no changes since this date.  Review of Systems     Cardiac Risk Factors include: advanced age (>52men, >56 women);dyslipidemia;obesity (BMI >30kg/m2)     Objective:    Today's Vitals   04/02/23 1334  Weight: 225 lb (102.1 kg)   Body mass index is 35.24 kg/m.     04/02/2023    1:40 PM 04/14/2022    9:40 AM 04/01/2021    9:39 AM 03/05/2021    1:41 PM 08/02/2020    2:55 PM 03/22/2020    9:35 AM  Advanced Directives  Does Patient Have a Medical Advance Directive? No Yes No Yes No No  Type of Special educational needs teacher of [place];Living will      Does patient want to make changes to medical advance directive?    Yes (ED - Information included in AVS)    Copy of Healthcare Power of Attorney in Chart?  No - copy requested      Would patient like information on creating a medical advance directive? No - Patient declined  Yes (MAU/Ambulatory/Procedural Areas - Information given)  No - Patient declined Yes (MAU/Ambulatory/Procedural Areas - Information given)    Current Medications (verified) Outpatient Encounter Medications as of 04/02/2023  Medication Sig   Cholecalciferol (VITAMIN D3) 10 MCG (400 UNIT) tablet Take 400 Units by mouth daily.   cyanocobalamin (VITAMIN B12) 500 MCG tablet Take 500 mcg by mouth daily.   Folic  Acid-Vit B6-Vit B12 (HOMOCYSTEINE FORMULA) 0.8-50-0.1 MG TABS    Magnesium 250 MG TABS Take by mouth.   meloxicam (MOBIC) 15 MG tablet Take 15 mg by mouth daily.   Misc Natural Products (GLUCOSAMINE CHOND MSM FORMULA PO) Take by mouth.   multivitamin-lutein (OCUVITE-LUTEIN) CAPS capsule Take 1 capsule by mouth daily.   Probiotic Product (PROBIOTIC PEARLS ADVANTAGE PO) Take by mouth.   SHINGRIX injection    tirzepatide (ZEPBOUND) 10 MG/0.5ML Pen Inject 10 mg into the skin once a week.   [DISCONTINUED] pantoprazole (PROTONIX) 40 MG tablet Take 1 tablet (40 mg total) by mouth daily.   [DISCONTINUED] tirzepatide (ZEPBOUND) 7.5 MG/0.5ML Pen Inject 7.5 mg into the skin once a week. (Patient taking differently: Inject 10 mg into the skin once a week.)   No facility-administered encounter medications on file as of 04/02/2023.    Allergies (verified) Cephalosporins   History: Past Medical History:  Diagnosis Date   Arthritis    Atypical nevus 06/11/2018   below left ear, moderate atypia   Atypical nevus 06/11/2018   right mid forehead, moderate to severe (wider shave done 07/08/2018)   Melanoma (HCC) 01/20/2019   melanoma in situ on right distal calf TX on 07/29/202   Varicose veins of lower extremity    Past Surgical History:  Procedure Laterality Date   ECTOPIC PREGNANCY SURGERY  1986   KNEE SURGERY Right    TONSILLECTOMY AND ADENOIDECTOMY     Family History  Problem Relation Age of Onset   Breast cancer Mother    Colon cancer Father        Diagnosed in 40s.    Breast cancer Sister    Breast cancer Paternal Aunt    Breast cancer Maternal Aunt    Social History   Socioeconomic History   Marital status: Married    Spouse name: Not on file   Number of children: Not on file   Years of education: Not on file   Highest education level: Not on file  Occupational History   Occupation: Retired  Tobacco Use   Smoking status: Never    Passive exposure: Never   Smokeless tobacco:  Never  Vaping Use   Vaping status: Never Used  Substance and Sexual Activity   Alcohol use: Yes    Alcohol/week: 3.0 standard drinks of alcohol    Types: 3 Glasses of wine per week   Drug use: Never   Sexual activity: Yes    Partners: Male  Other Topics Concern   Not on file  Social History Narrative   Not on file   Social Determinants of Health   Financial Resource Strain: Low Risk  (03/30/2023)   Overall Financial Resource Strain (CARDIA)    Difficulty of Paying Living Expenses: Not hard at all  Food Insecurity: No Food Insecurity (03/30/2023)   Hunger Vital Sign    Worried About Running Out of Food in the Last Year: Never true    Ran Out of Food in the Last Year: Never true  Transportation Needs: No Transportation Needs (03/30/2023)   PRAPARE - Administrator, Civil Service (Medical): No    Lack of Transportation (Non-Medical): No  Physical Activity: Insufficiently Active (03/30/2023)   Exercise Vital Sign    Days of Exercise per Week: 3 days    Minutes of Exercise per Session: 20 min  Stress: No Stress Concern Present (03/30/2023)   [employer] of Occupational Health - Occupational Stress Questionnaire    Feeling of Stress : Not at all  Social Connections: Socially Integrated (03/30/2023)   Social Connection and Isolation Panel [NHANES]    Frequency of Communication with Friends and Family: More than three times a week    Frequency of Social Gatherings with Friends and Family: Twice a week    Attends Religious Services: More than 4 times per year    Active Member of [employer] or Organizations: Yes    Attends Engineer, structural: More than 4 times per year    Marital Status: Married    Tobacco Counseling Counseling given: Not Answered   Clinical Intake:  Pre-visit preparation completed: Yes  Pain : No/denies pain     BMI - recorded: 35.24 Nutritional Status: BMI > 30  Obese Nutritional Risks: None Diabetes: No  How often do you need to have  someone help you when you read instructions, pamphlets, or other written materials from your doctor or pharmacy?: 2 - Rarely  Interpreter Needed?: No  Information entered by :: Lanier Ensign, LPN   Activities of Daily Living    03/30/2023    9:43 AM 04/14/2022    9:43 AM  In your present state of health, do you have any difficulty performing the following activities:  Hearing? 0 0  Vision? 0 0  Difficulty concentrating or making decisions? 0 0  Walking or climbing stairs? 0 0  Dressing or bathing? 0 0  Doing errands, shopping? 0 0  Preparing Food and eating ? N  N  Using the Toilet? N N  In the past six months, have you accidently leaked urine? N N  Do you have problems with loss of bowel control? N N  Managing your Medications? N N  Managing your Finances? N N  Housekeeping or managing your Housekeeping? N N    Patient Care Team: Ardith Dark, MD as PCP - General (Family Medicine) Clark-Burning, Victorino Dike, PA-C (Inactive) (Dermatology) Glyn Ade, PA-C as Physician Assistant (Dermatology)  Indicate any recent Medical Services you may have received from other than [facility] providers in the past year (date may be approximate).     Assessment:   This is a routine wellness examination for Kathy Joseph.  Hearing/Vision screen Hearing Screening - Comments:: Pt denies any hearing issues  Vision Screening - Comments:: Pt follows up with Dr Abelina Bachelor for annual ye exams    Goals Addressed             This Visit's Progress    Patient Stated       Continue to lose weight        Depression Screen    04/02/2023    1:38 PM 11/19/2022    9:35 AM 09/16/2022    9:05 AM 04/14/2022    9:41 AM 04/25/2021    9:47 AM 04/01/2021    9:37 AM 03/22/2020    9:30 AM  PHQ 2/9 Scores  PHQ - 2 Score 0 0 0 0 0 0 1    Fall Risk    03/30/2023    9:43 AM 11/19/2022    9:35 AM 09/16/2022    9:05 AM 04/14/2022    9:43 AM 04/25/2021    9:47 AM  Fall Risk   Falls in the past year? 0 0 0 0 0   Number falls in past yr: 0 0 0 0   Injury with Fall? 0 0 0 0   Risk for fall due to : Impaired vision No Fall Risks No Fall Risks No Fall Risks;Impaired vision;Impaired balance/gait No Fall Risks  Risk for fall due to: Comment    related to back concerns   Follow up Falls prevention discussed   Falls prevention discussed     MEDICARE RISK AT HOME: Medicare Risk at Home Any stairs in or around the home?: Yes If so, are there any without handrails?: No Home free of loose throw rugs in walkways, pet beds, electrical cords, etc?: No Adequate lighting in your home to reduce risk of falls?: Yes Life alert?: No Use of a cane, walker or w/c?: No Grab bars in the bathroom?: No Shower chair or bench in shower?: Yes Elevated toilet seat or a handicapped toilet?: No  TIMED UP AND GO:  Was the test performed?  No    Cognitive Function:        04/02/2023    1:40 PM 04/14/2022    9:44 AM 04/01/2021    9:43 AM 03/22/2020    9:38 AM  6CIT Screen  What Year? 0 points 0 points 0 points 0 points  What month? 0 points 0 points 0 points 0 points  What time? 0 points 0 points 0 points   Count back from 20 0 points 0 points 0 points 0 points  Months in reverse 0 points 0 points 0 points 0 points  Repeat phrase 0 points 0 points 0 points 0 points  Total Score 0 points 0 points 0 points     Immunizations Immunization History  Administered Date(s) Administered  Fluad Quad(high Dose 65+) 03/14/2019   Influenza,inj,Quad PF,6+ Mos 05/04/2018   Influenza-Unspecified 04/16/2022   Moderna Sars-Covid-2 Vaccination 08/24/2019, 09/21/2019, 05/28/2020   PNEUMOCOCCAL CONJUGATE-20 06/21/2020   Pfizer Covid-19 Vaccine Bivalent Booster 21yrs & up 11/30/2020   Pfizer Covid-19 Vaccine Bivalent Booster 5y-11y 06/25/2021   Pneumococcal Polysaccharide-23 03/14/2019   Zoster Recombinant(Shingrix) 08/12/2022, 11/06/2022    TDAP status: Due, Education has been provided regarding the importance of this vaccine.  Advised may receive this vaccine at local pharmacy or Health Dept. Aware to provide a copy of the vaccination record if obtained from local pharmacy or Health Dept. Verbalized acceptance and understanding.  Flu Vaccine status: Due, Education has been provided regarding the importance of this vaccine. Advised may receive this vaccine at local pharmacy or Health Dept. Aware to provide a copy of the vaccination record if obtained from local pharmacy or Health Dept. Verbalized acceptance and understanding.  Pneumococcal vaccine status: Up to date  Covid-19 vaccine status: Information provided on how to obtain vaccines.   Qualifies for Shingles Vaccine? Yes   Zostavax completed Yes   Shingrix Completed?: Yes  Screening Tests Health Maintenance  Topic Date Due   DTaP/Tdap/Td (1 - Tdap) Never done   MAMMOGRAM  10/09/2022   COVID-19 Vaccine (6 - 2023-24 season) 03/22/2023   INFLUENZA VACCINE  10/19/2023 (Originally 02/19/2023)   Medicare Annual Wellness (AWV)  04/01/2024   DEXA SCAN  09/17/2024   Colonoscopy  01/27/2027   Pneumonia Vaccine 82+ Years old  Completed   Hepatitis C Screening  Completed   Zoster Vaccines- Shingrix  Completed   HPV VACCINES  Aged Out    Health Maintenance  Health Maintenance Due  Topic Date Due   DTaP/Tdap/Td (1 - Tdap) Never done   MAMMOGRAM  10/09/2022   COVID-19 Vaccine (6 - 2023-24 season) 03/22/2023    Colorectal cancer screening: Type of screening: Colonoscopy. Completed 01/26/17. Repeat every 10 years  Mammogram status: Completed 03/19/23. Repeat every year  Bone Density status: Completed 09/18/22. Results reflect: Bone density results: OSTEOPOROSIS. Repeat every 2 years.  Additional Screening:  Hepatitis C Screening: Completed 03/14/19  Vision Screening: Recommended annual ophthalmology exams for early detection of glaucoma and other disorders of the eye. Is the patient up to date with their annual eye exam?  Yes  Who is the provider or what is  the name of the office in which the patient attends annual eye exams? Dr Victorino Dike  If pt is not established with a provider, would they like to be referred to a provider to establish care? No .   Dental Screening: Recommended annual dental exams for proper oral hygiene    Community Resource Referral / Chronic Care Management: CRR required this visit?  No   CCM required this visit?  No     Plan:     I have personally reviewed and noted the following in the patient's chart:   Medical and social history Use of alcohol, tobacco or illicit drugs  Current medications and supplements including opioid prescriptions. Patient is not currently taking opioid prescriptions. Functional ability and status Nutritional status Physical activity Advanced directives List of other physicians Hospitalizations, surgeries, and ER visits in previous 12 months Vitals Screenings to include cognitive, depression, and falls Referrals and appointments  In addition, I have reviewed and discussed with patient certain preventive protocols, quality metrics, and best practice recommendations. A written personalized care plan for preventive services as well as general preventive health recommendations were provided to patient.  Marzella Schlein, LPN   [id number]   After Visit Summary: (MyChart) Due to this being a telephonic visit, the after visit summary with patients personalized plan was offered to patient via MyChart   Nurse Notes: none

## 2023-04-02 NOTE — Patient Instructions (Addendum)
Kathy Joseph , Thank you for taking time to come for your Medicare Wellness Visit. I appreciate your ongoing commitment to your health goals. Please review the following plan we discussed and let me know if I can assist you in the future.   Referrals/Orders/Follow-Ups/Clinician Recommendations: continue to lose weight   This is a list of the screening recommended for you and due dates:  Health Maintenance  Topic Date Due   DTaP/Tdap/Td vaccine (1 - Tdap) Never done   Mammogram  10/09/2022   COVID-19 Vaccine (6 - 2023-24 season) 03/22/2023   Flu Shot  10/19/2023*   Medicare Annual Wellness Visit  04/01/2024   DEXA scan (bone density measurement)  09/17/2024   Colon Cancer Screening  01/27/2027   Pneumonia Vaccine  Completed   Hepatitis C Screening  Completed   Zoster (Shingles) Vaccine  Completed   HPV Vaccine  Aged Out  *Topic was postponed. The date shown is not the original due date.    Advanced directives: (Copy Requested) Please bring a copy of your health care power of attorney and living will to the office to be added to your chart at your convenience.  Next Medicare Annual Wellness Visit scheduled for next year: Yes

## 2023-04-09 ENCOUNTER — Encounter: Payer: Self-pay | Admitting: Family Medicine

## 2023-04-09 DIAGNOSIS — C50919 Malignant neoplasm of unspecified site of unspecified female breast: Secondary | ICD-10-CM | POA: Diagnosis not present

## 2023-04-09 DIAGNOSIS — C50411 Malignant neoplasm of upper-outer quadrant of right female breast: Secondary | ICD-10-CM | POA: Diagnosis not present

## 2023-04-10 NOTE — Telephone Encounter (Signed)
She can try over-the-counter Pepcid to see if this will manage her symptoms.  This is less harsh than the Protonix but still can treat reflux.  Less long-term side effects with this than the Protonix.  She should follow-up with [name] in a few weeks to let us know if the Pepcid is not working.

## 2023-04-10 NOTE — Telephone Encounter (Signed)
Please advise 

## 2023-04-15 ENCOUNTER — Telehealth: Payer: PPO | Admitting: Family Medicine

## 2023-04-15 ENCOUNTER — Encounter: Payer: Self-pay | Admitting: Family Medicine

## 2023-04-15 VITALS — Ht 67.0 in | Wt 218.0 lb

## 2023-04-15 DIAGNOSIS — K219 Gastro-esophageal reflux disease without esophagitis: Secondary | ICD-10-CM

## 2023-04-15 DIAGNOSIS — E669 Obesity, unspecified: Secondary | ICD-10-CM

## 2023-04-15 MED ORDER — ZEPBOUND 12.5 MG/0.5ML ~~LOC~~ SOAJ: 12.5000 mg | SUBCUTANEOUS | 0 refills | Status: DC

## 2023-04-15 NOTE — Progress Notes (Signed)
   Kathy Joseph is a 67 y.o. female who presents today for a virtual office visit.  Assessment/Plan:  Chronic Problems Addressed Today: Obesity Patient on Zepbound 10 mg weekly.  She has had quite a bit of weight loss with this and is down almost 30 pounds since starting.  She is happy with her weight loss.  She is starting to get more side effects including constipation and reflux.  So far this has been managed with Pepcid and dietary changes.  She is interested in increasing the dose.  We did discuss that her side effects may worsen with this but they are currently manageable so hopefully this will not be an issue.  Will go to 12.5 mg weekly.  She will send a message in a few weeks via MyChart to let us know how she is doing with the higher dose.  GERD (gastroesophageal reflux disease) Recently started Pepcid.  Likely worsened recently due to increasing dose of Zepbound.  She can continue taking over-the-counter Pepcid for now.  She will let us know if this continues to be an issue.     Subjective:  HPI:  See assessment / plan for status of chronic conditions.  She is here today to discuss her zebound. She is currently on 10 mg weekly. She has had significant weightloss thus far however since going to the higher dose she is having more issues with heartburn and reflux. She is also having more issues with constipation. She is managing her constipation by modifying her diet and getting more fruits and vegetables. She started pepcid for her reflux which seems to be working well. She has also been trying to eat less which seems to be helping with her reflux symptoms as well.       Objective/Observations  Physical Exam: Wt Readings from Last 3 Encounters:  04/15/23 218 lb (98.9 kg)  04/02/23 225 lb (102.1 kg)  02/25/23 225 lb 14.4 oz (102.5 kg)    Gen: NAD, resting comfortably Pulm: Normal work of breathing Neuro: Grossly normal, moves all extremities Psych: Normal affect and thought  content  Virtual Visit via Video   I connected with Kathy Joseph on 04/15/23 at  9:40 AM EDT by a video enabled telemedicine application and verified that I am speaking with the correct person using two identifiers. The limitations of evaluation and management by telemedicine and the availability of in person appointments were discussed. The patient expressed understanding and agreed to proceed.   Patient location: Home Provider location: [place] [employer] Persons participating in the virtual visit: Myself and Patient     Katina Degree. [name], MD 04/15/2023 10:31 AM

## 2023-04-15 NOTE — Assessment & Plan Note (Signed)
Patient on Zepbound 10 mg weekly.  She has had quite a bit of weight loss with this and is down almost 30 pounds since starting.  She is happy with her weight loss.  She is starting to get more side effects including constipation and reflux.  So far this has been managed with Pepcid and dietary changes.  She is interested in increasing the dose.  We did discuss that her side effects may worsen with this but they are currently manageable so hopefully this will not be an issue.  Will go to 12.5 mg weekly.  She will send a message in a few weeks via MyChart to let us know how she is doing with the higher dose.

## 2023-04-15 NOTE — Assessment & Plan Note (Signed)
Recently started Pepcid.  Likely worsened recently due to increasing dose of Zepbound.  She can continue taking over-the-counter Pepcid for now.  She will let us know if this continues to be an issue.

## 2023-04-20 ENCOUNTER — Encounter: Payer: Self-pay | Admitting: Family Medicine

## 2023-04-21 NOTE — Telephone Encounter (Signed)
I have not seen form.  Kathy Joseph. [name], MD 04/21/2023 12:42 PM

## 2023-04-22 ENCOUNTER — Telehealth: Payer: Self-pay | Admitting: *Deleted

## 2023-04-22 NOTE — Telephone Encounter (Signed)
Patient has been scheduled for 10/7 @ 10:40 am w/ PCP.

## 2023-04-22 NOTE — Telephone Encounter (Signed)
Patient need appt with PCP for surgery Clearance  Please schedule

## 2023-04-27 ENCOUNTER — Ambulatory Visit (INDEPENDENT_AMBULATORY_CARE_PROVIDER_SITE_OTHER): Payer: PPO | Admitting: Family Medicine

## 2023-04-27 ENCOUNTER — Encounter: Payer: Self-pay | Admitting: Family Medicine

## 2023-04-27 VITALS — BP 110/74 | HR 74 | Temp 97.1°F | Ht 67.0 in | Wt 204.2 lb

## 2023-04-27 DIAGNOSIS — Z6831 Body mass index (BMI) 31.0-31.9, adult: Secondary | ICD-10-CM

## 2023-04-27 DIAGNOSIS — M1990 Unspecified osteoarthritis, unspecified site: Secondary | ICD-10-CM | POA: Diagnosis not present

## 2023-04-27 DIAGNOSIS — E669 Obesity, unspecified: Secondary | ICD-10-CM | POA: Diagnosis not present

## 2023-04-27 DIAGNOSIS — Z01818 Encounter for other preprocedural examination: Secondary | ICD-10-CM

## 2023-04-27 LAB — CBC
HCT: 42 % (ref 36.0–46.0)
Hemoglobin: 13.9 g/dL (ref 12.0–15.0)
MCHC: 33 g/dL (ref 30.0–36.0)
MCV: 94.8 fL (ref 78.0–100.0)
Platelets: 195 10*3/uL (ref 150.0–400.0)
RBC: 4.43 Mil/uL (ref 3.87–5.11)
RDW: 12.2 % (ref 11.5–15.5)
WBC: 4.1 10*3/uL (ref 4.0–10.5)

## 2023-04-27 LAB — COMPREHENSIVE METABOLIC PANEL
ALT: 15 U/L (ref 0–35)
AST: 17 U/L (ref 0–37)
Albumin: 4.1 g/dL (ref 3.5–5.2)
Alkaline Phosphatase: 71 U/L (ref 39–117)
BUN: 14 mg/dL (ref 6–23)
CO2: 26 meq/L (ref 19–32)
Calcium: 9.5 mg/dL (ref 8.4–10.5)
Chloride: 103 meq/L (ref 96–112)
Creatinine, Ser: 0.81 mg/dL (ref 0.40–1.20)
GFR: 74.04 mL/min (ref >60.00)
Glucose, Bld: 87 mg/dL (ref 70–99)
Potassium: 3.9 meq/L (ref 3.5–5.1)
Sodium: 137 meq/L (ref 135–145)
Total Bilirubin: 0.6 mg/dL (ref 0.2–1.2)
Total Protein: 6.7 g/dL (ref 6.0–8.3)

## 2023-04-27 LAB — HEMOGLOBIN A1C: Hgb A1c MFr Bld: 5.3 % (ref 4.6–6.5)

## 2023-04-27 NOTE — Patient Instructions (Signed)
It was very nice to see you today!  We will check labs for your surgery.  Will fax over clearance form once we get your labs back.  Please let us know when you would like to increase the dose of your Mounjaro.  Return if symptoms worsen or fail to improve.   Take care, Dr [name]  PLEASE NOTE:  If you had any lab tests, please let us know if you have not heard back within a few days. You may see your results on mychart before we have a chance to review them but we will give you a call once they are reviewed by [name].   If we ordered any referrals today, please let us know if you have not heard from their office within the next week.   If you had any urgent prescriptions sent in today, please check with the pharmacy within an hour of our visit to make sure the prescription was transmitted appropriately.   Please try these tips to maintain a healthy lifestyle:  Eat at least 3 REAL meals and 1-2 snacks per day.  Aim for no more than 5 hours between eating.  If you eat breakfast, please do so within one hour of getting up.   Each meal should contain half fruits/vegetables, one quarter protein, and one quarter carbs (no bigger than a computer mouse)  Cut down on sweet beverages. This includes juice, soda, and sweet tea.   Drink at least 1 glass of water with each meal and aim for at least 8 glasses per day  Exercise at least 150 minutes every week.

## 2023-04-27 NOTE — Assessment & Plan Note (Signed)
Symptoms are managed via orthopedics.  Recently had a Synvisc injection performed which did well however she is planning on upcoming knee replacement.  Will be completing her surgical clearance form as above.

## 2023-04-27 NOTE — Assessment & Plan Note (Signed)
Patient down about 8 pounds over the last month or so.  She is doing well with current dose at down 12.5 mg daily.  She will continue current dose and send [name] a message in a few weeks to let us know how she is doing.  We can increase dose to 15 mg weekly if needed.

## 2023-04-27 NOTE — Progress Notes (Signed)
   Kathy Joseph is a 67 y.o. female who presents today for an office visit.  Assessment/Plan:  New/Acute Problems: Encounter For Surgical Clearance Will check requested labs today including metabolic panel, A1c, and CBC.  She has excellent functional status and is optimized from medical standpoint.  Assuming her labs are normal we will fax clearance letter to her surgeon's office.  Chronic Problems Addressed Today: Arthritis Symptoms are managed via orthopedics.  Recently had a Synvisc injection performed which did well however she is planning on upcoming knee replacement.  Will be completing her surgical clearance form as above.  Obesity Patient down about 8 pounds over the last month or so.  She is doing well with current dose at down 12.5 mg daily.  She will continue current dose and send [name] a message in a few weeks to let us know how she is doing.  We can increase dose to 15 mg weekly if needed.      Subjective:  HPI:  See A/P for status of chronic conditions.  Patient is here today to request of Dr. Sherlean Foot for surgical clearance for upcoming right total knee replacement.  She has a long standing history of osteoarthritis that has continued to progress and not adequately respond to conservative management. They have requested that we order labs including CBC, bmet, and A1c.  She has excellent functional status. No chest pain or shortness of breath.        Objective:  Physical Exam: BP 110/74   Pulse 74   Temp (!) 97.1 F (36.2 C)   Ht 5\' 7"  (1.702 m)   Wt 204 lb 3.2 oz (92.6 kg)   SpO2 95%   BMI 31.98 kg/m   Wt Readings from Last 3 Encounters:  04/27/23 204 lb 3.2 oz (92.6 kg)  04/15/23 218 lb (98.9 kg)  04/02/23 225 lb (102.1 kg)    Gen: No acute distress, resting comfortably CV: Regular rate and rhythm with no murmurs appreciated Pulm: Normal work of breathing, clear to auscultation bilaterally with no crackles, wheezes, or rhonchi Neuro: Grossly normal, moves all  extremities Psych: Normal affect and thought content      [place] Haydon M. [name], MD 04/27/2023 11:07 AM

## 2023-04-28 NOTE — Progress Notes (Signed)
Great news!  Labs are all normal.  We can fax over surgical clearance form.

## 2023-05-07 ENCOUNTER — Other Ambulatory Visit: Payer: Self-pay | Admitting: Family Medicine

## 2023-05-11 NOTE — Telephone Encounter (Signed)
Patient requests completed Surgical Clearance form for knee replacement be faxed to Fax# [phone] ATTN: CHERYL

## 2023-05-11 NOTE — Telephone Encounter (Signed)
Surgical form was faxed on 04/29/2023 and refaxed today to fax #[phone]

## 2023-05-14 ENCOUNTER — Encounter: Payer: Self-pay | Admitting: Family Medicine

## 2023-05-15 ENCOUNTER — Other Ambulatory Visit: Payer: Self-pay | Admitting: *Deleted

## 2023-05-15 MED ORDER — ZEPBOUND 15 MG/0.5ML ~~LOC~~ SOAJ: 15.0000 mg | SUBCUTANEOUS | 1 refills | Status: DC

## 2023-05-15 NOTE — Telephone Encounter (Signed)
Please advise 

## 2023-05-15 NOTE — Telephone Encounter (Signed)
Rx send to pharmacy  

## 2023-05-15 NOTE — Telephone Encounter (Signed)
Ok to send in 15 mg weekly dose.  Kathy Joseph. [name], MD 05/15/2023 8:40 AM

## 2023-05-25 ENCOUNTER — Other Ambulatory Visit: Payer: Self-pay | Admitting: *Deleted

## 2023-05-25 ENCOUNTER — Other Ambulatory Visit (HOSPITAL_BASED_OUTPATIENT_CLINIC_OR_DEPARTMENT_OTHER): Payer: Self-pay

## 2023-05-25 MED ORDER — ZEPBOUND 15 MG/0.5ML ~~LOC~~ SOAJ
15.0000 mg | SUBCUTANEOUS | 1 refills | Status: DC
  Filled 2023-05-25: qty 2, 28d supply, fill #0

## 2023-05-26 DIAGNOSIS — G8929 Other chronic pain: Secondary | ICD-10-CM | POA: Diagnosis not present

## 2023-05-26 DIAGNOSIS — M25561 Pain in right knee: Secondary | ICD-10-CM | POA: Diagnosis not present

## 2023-05-26 DIAGNOSIS — M1711 Unilateral primary osteoarthritis, right knee: Secondary | ICD-10-CM | POA: Diagnosis not present

## 2023-06-30 DIAGNOSIS — M1711 Unilateral primary osteoarthritis, right knee: Secondary | ICD-10-CM | POA: Diagnosis not present

## 2023-07-02 ENCOUNTER — Encounter: Payer: Self-pay | Admitting: Family Medicine

## 2023-07-02 ENCOUNTER — Other Ambulatory Visit: Payer: Self-pay | Admitting: *Deleted

## 2023-07-02 MED ORDER — ZEPBOUND 15 MG/0.5ML ~~LOC~~ SOAJ: 15.0000 mg | SUBCUTANEOUS | 1 refills | Status: DC

## 2023-07-02 NOTE — Telephone Encounter (Signed)
LVM I call to help with address change, can send new address via mychart for [name] to change it or give [name] a call to be change

## 2023-07-31 DIAGNOSIS — M25561 Pain in right knee: Secondary | ICD-10-CM | POA: Diagnosis not present

## 2023-07-31 DIAGNOSIS — M1711 Unilateral primary osteoarthritis, right knee: Secondary | ICD-10-CM | POA: Diagnosis not present

## 2023-07-31 DIAGNOSIS — G8929 Other chronic pain: Secondary | ICD-10-CM | POA: Diagnosis not present

## 2023-08-12 DIAGNOSIS — Z96651 Presence of right artificial knee joint: Secondary | ICD-10-CM | POA: Diagnosis not present

## 2023-08-12 DIAGNOSIS — G8918 Other acute postprocedural pain: Secondary | ICD-10-CM | POA: Diagnosis not present

## 2023-08-12 DIAGNOSIS — M1711 Unilateral primary osteoarthritis, right knee: Secondary | ICD-10-CM | POA: Diagnosis not present

## 2023-08-12 DIAGNOSIS — M25761 Osteophyte, right knee: Secondary | ICD-10-CM | POA: Diagnosis not present

## 2023-08-20 DIAGNOSIS — Z96651 Presence of right artificial knee joint: Secondary | ICD-10-CM | POA: Diagnosis not present

## 2023-08-20 DIAGNOSIS — M25561 Pain in right knee: Secondary | ICD-10-CM | POA: Diagnosis not present

## 2023-08-24 DIAGNOSIS — M25561 Pain in right knee: Secondary | ICD-10-CM | POA: Diagnosis not present

## 2023-08-27 DIAGNOSIS — M25561 Pain in right knee: Secondary | ICD-10-CM | POA: Diagnosis not present

## 2023-08-28 ENCOUNTER — Other Ambulatory Visit: Payer: Self-pay | Admitting: Oncology

## 2023-08-28 ENCOUNTER — Inpatient Hospital Stay: Payer: PPO | Admitting: Oncology

## 2023-08-28 ENCOUNTER — Inpatient Hospital Stay: Payer: PPO | Attending: Oncology

## 2023-08-28 VITALS — BP 133/65 | HR 70 | Temp 97.9°F | Resp 16 | Ht 67.0 in | Wt 198.9 lb

## 2023-08-28 DIAGNOSIS — D704 Cyclic neutropenia: Secondary | ICD-10-CM

## 2023-08-28 DIAGNOSIS — C50919 Malignant neoplasm of unspecified site of unspecified female breast: Secondary | ICD-10-CM

## 2023-08-28 DIAGNOSIS — Z853 Personal history of malignant neoplasm of breast: Secondary | ICD-10-CM | POA: Insufficient documentation

## 2023-08-28 LAB — CMP (CANCER CENTER ONLY)
ALT: 30 U/L (ref 0–44)
AST: 30 U/L (ref 15–41)
Albumin: 3.6 g/dL (ref 3.5–5.0)
Alkaline Phosphatase: 123 U/L (ref 38–126)
Anion gap: 9 (ref 5–15)
BUN: 16 mg/dL (ref 8–23)
CO2: 28 mmol/L (ref 22–32)
Calcium: 9.4 mg/dL (ref 8.9–10.3)
Chloride: 103 mmol/L (ref 98–111)
Creatinine: 0.73 mg/dL (ref 0.44–1.00)
GFR, Estimated: >60 mL/min (ref >60)
Glucose, Bld: 92 mg/dL (ref 70–99)
Potassium: 4 mmol/L (ref 3.5–5.1)
Sodium: 140 mmol/L (ref 135–145)
Total Bilirubin: 0.3 mg/dL (ref 0.0–1.2)
Total Protein: 6.3 g/dL — ABNORMAL LOW (ref 6.5–8.1)

## 2023-08-28 LAB — CBC WITH DIFFERENTIAL (CANCER CENTER ONLY)
Abs Immature Granulocytes: 0.01 10*3/uL (ref 0.00–0.07)
Basophils Absolute: 0.1 10*3/uL (ref 0.0–0.1)
Basophils Relative: 2 %
Eosinophils Absolute: 0.2 10*3/uL (ref 0.0–0.5)
Eosinophils Relative: 4 %
HCT: 35.3 % — ABNORMAL LOW (ref 36.0–46.0)
Hemoglobin: 11.9 g/dL — ABNORMAL LOW (ref 12.0–15.0)
Immature Granulocytes: 0 %
Lymphocytes Relative: 28 %
Lymphs Abs: 1.1 10*3/uL (ref 0.7–4.0)
MCH: 32.8 pg (ref 26.0–34.0)
MCHC: 33.7 g/dL (ref 30.0–36.0)
MCV: 97.2 fL (ref 80.0–100.0)
Monocytes Absolute: 0.3 10*3/uL (ref 0.1–1.0)
Monocytes Relative: 8 %
Neutro Abs: 2.3 10*3/uL (ref 1.7–7.7)
Neutrophils Relative %: 58 %
Platelet Count: 284 10*3/uL (ref 150–400)
RBC: 3.63 MIL/uL — ABNORMAL LOW (ref 3.87–5.11)
RDW: 12.5 % (ref 11.5–15.5)
WBC Count: 3.9 10*3/uL — ABNORMAL LOW (ref 4.0–10.5)
nRBC: 0 % (ref 0.0–0.2)
nRBC: 0 /100{WBCs}

## 2023-08-28 NOTE — Progress Notes (Signed)
 [facility]  [address] [place],  [place]  [address] [phone]  Clinic Day: 08/28/23  Referring physician: Rodney Clamp, MD   CHIEF COMPLAINT:  CC: History of mammary angiosarcoma   Current Treatment:  Surveillance  HISTORY OF PRESENT ILLNESS:  Kathy Joseph is a 68 y.o. female with mammary angiosarcoma of the right breast, diagnosed in August 2021.  This was found on a screening mammogram in November 2020, where she was found to have possible asymmetry in the right breast.  Diagnostic right mammogram and ultrasound revealed the asymmetry to persist on additional views, but ultrasound did not reveal any suspicious masses.  There were areas of focal fibroglandular tissue that may correspond to the abnormality.  Six-month follow-up right diagnostic mammogram and ultrasound were recommended.  In May 2021, there was an increase in the focal asymmetry in the right outer breast.  Ultrasound revealed an irregular mass at 8 o'clock 6 cm from the nipple measuring 1.9 x 1.7 x 1.1 cm.  No enlarged axillary lymph nodes were seen.  Biopsy revealed an angiolipoma with a differential diagnosis of fat necrosis versus malignancy.  The radiologist felt the findings were concordant, but as he was still concerned about the appearance on ultrasound, excision was recommended, so she was referred to Dr. Britta Candy.  She underwent needle localized lumpectomy on August 21st and surgical pathology from this procedure revealed mammary angiosarcoma.  These results were sent to [facility], and confirmed.  Since even low grade lesions of this type have a substantial risk of distant metastasis, tumors of this type are generally treated by mastectomy.  CT chest from September 1st revealed no evidence of metastatic disease in the chest.  Bilateral calcified pulmonary nodules are consistent with prior granulomatous disease.  There is a 9 mm subpleural nodule in the left upper lobe with curvilinear calcifications.   Regardless this appears benign.  Multiple fat density lesions involving both kidneys are consistent with multiple angiomyolipomas.  There is also an indeterminate 15 mm lesion arising from the posterior right kidney, which may represent a lipid poor angiomyolipoma.  Further characterization with renal protocol MRI was recommended.  Hypodense lesion in the left lobe of the thyroid  gland measuring 12 mm.  Given size, this is not clinically significant.  She has been seen at [facility] in consultation and will be returning for their recommendations.  She started menarche at age 28.  She has had 4 pregnancies, with 3 live births.  Her 1st live birth was at age 88.  She was placed on tamoxifen as chemoprevention for 2-3 years, but this was discontinued after she developed a superficial venous thrombosis of the left leg.  She took oral contraceptives for 9 years after her last child.  The professors at [facility] reviewed the surgical pathology from her procedure, and they did not recommend any further excision at this time as her margins were clear.  The closest margin was 1.5 mm.  They did recommend close follow up with breast MRI and CT imaging every 6 months for the next 2 years. CT and MRI imaging from March 2022 at [facility] are benign.   INTERVAL HISTORY:  Kathy Joseph is here for routine follow up for history of mammary angiosarcoma.  She was also found to have a splenic aneurysm. Patient states that she feels well but complains of right knee pain rating 3/10 as she had right knee replacement surgery on 08/06/2023. She will have repeat MRI of the breast and CT scan  done in March at [facility]. Patient notes since losing weight her back/shoulder pain has significantly improved. She has a low WBC of 3.9, a low hemoglobin of 11.9 down from 13.9, and a platelet count of 284,000. Her drop in hemoglobin is likely related to her recent surgery.  I encouraged her to increase iron in her diet. Her CMP is normal other than a  low total protein of 6.3. I will see her back in 6 months with CBC and CMP.   She denies signs of infection such as sore throat, sinus drainage, cough, or urinary symptoms.  She denies fevers or recurrent chills.  She denies nausea, vomiting, chest pain, dyspnea or cough. Her appetite is good and her weight has decreased 6 pounds over last 4 months.   REVIEW OF SYSTEMS:   Review of Systems  Constitutional: Negative.  Negative for appetite change, chills, diaphoresis, fatigue, fever and unexpected weight change.  HENT:  Negative.  Negative for hearing loss, lump/mass, mouth sores, nosebleeds, sore throat, tinnitus, trouble swallowing and voice change.   Eyes: Negative.  Negative for eye problems and icterus.  Respiratory: Negative.  Negative for chest tightness, cough, hemoptysis, shortness of breath and wheezing.   Cardiovascular: Negative.  Negative for chest pain, leg swelling and palpitations.  Gastrointestinal: Negative.  Negative for abdominal distention, abdominal pain, blood in stool, constipation, diarrhea, nausea, rectal pain and vomiting.  Endocrine: Negative.   Genitourinary: Negative.  Negative for bladder incontinence, difficulty urinating, dyspareunia, dysuria, frequency, hematuria, menstrual problem, nocturia, pelvic pain, vaginal bleeding and vaginal discharge.   Musculoskeletal:  Positive for arthralgias. Negative for back pain, flank pain, gait problem, myalgias, neck pain and neck stiffness.       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  Right knee pain and swelling rating 3/10 due to knee replacement surgery  Skin: Negative.  Negative for itching, rash and wound.  Neurological: Negative.  Negative for dizziness, extremity weakness, gait problem, headaches, light-headedness, numbness, seizures and speech difficulty.  Hematological: Negative.  Negative for adenopathy. Does not bruise/bleed easily.  Psychiatric/Behavioral: Negative.  Negative for confusion, decreased concentration,  depression, sleep disturbance and suicidal ideas. The patient is not nervous/anxious.      VITALS:  Blood pressure 133/65, pulse 70, temperature 97.9 F (36.6 C), temperature source Oral, resp. rate 16, height 5\' 7"  (1.702 m), weight 198 lb 14.4 oz (90.2 kg), SpO2 98%.  Wt Readings from Last 3 Encounters:  11/27/23 195 lb (88.5 kg)  08/28/23 198 lb 14.4 oz (90.2 kg)  04/27/23 204 lb 3.2 oz (92.6 kg)    Body mass index is 31.15 kg/m.  Performance status (ECOG): 0 - Asymptomatic  PHYSICAL EXAM:  Physical Exam Vitals and nursing note reviewed.  Constitutional:      General: She is not in acute distress.    Appearance: Normal appearance. She is normal weight. She is not ill-appearing, toxic-appearing or diaphoretic.  HENT:     Head: Normocephalic and atraumatic.     Right Ear: Tympanic membrane, ear canal and external ear normal. There is no impacted cerumen.     Left Ear: Tympanic membrane, ear canal and external ear normal. There is no impacted cerumen.     Nose: Nose normal. No congestion or rhinorrhea.     Mouth/Throat:     Mouth: Mucous membranes are moist.     Pharynx: Oropharynx is clear. No oropharyngeal exudate or posterior oropharyngeal erythema.  Eyes:     General: No scleral icterus.  Right eye: No discharge.        Left eye: No discharge.     Extraocular Movements: Extraocular movements intact.     Conjunctiva/sclera: Conjunctivae normal.     Pupils: Pupils are equal, round, and reactive to light.  Neck:     Vascular: No carotid bruit.  Cardiovascular:     Rate and Rhythm: Normal rate and regular rhythm.     Pulses: Normal pulses.     Heart sounds: Normal heart sounds. No murmur heard.    No friction rub. No gallop.  Pulmonary:     Effort: Pulmonary effort is normal. No respiratory distress.     Breath sounds: Normal breath sounds. No stridor. No wheezing, rhonchi or rales.  Chest:     Chest wall: No tenderness.     Comments: No masses in either  breast.  Abdominal:     General: Bowel sounds are normal. There is no distension.     Palpations: Abdomen is soft. There is no hepatomegaly, splenomegaly or mass.     Tenderness: There is no abdominal tenderness. There is no right CVA tenderness, left CVA tenderness, guarding or rebound.     Hernia: No hernia is present.  Musculoskeletal:        General: No swelling, tenderness, deformity or signs of injury. Normal range of motion.     Cervical back: Normal range of motion and neck supple. No rigidity or tenderness.     Right lower leg: No edema.     Left lower leg: No edema.     Comments: No spinal tenderness but slight scoliosis.   Lymphadenopathy:     Cervical: No cervical adenopathy.  Skin:    General: Skin is warm and dry.     Coloration: Skin is not jaundiced or pale.     Findings: No bruising, erythema, lesion or rash.  Neurological:     General: No focal deficit present.     Mental Status: She is alert and oriented to person, place, and time. Mental status is at baseline.     Cranial Nerves: No cranial nerve deficit.     Sensory: No sensory deficit.     Motor: No weakness.     Coordination: Coordination normal.     Gait: Gait normal.     Deep Tendon Reflexes: Reflexes normal.  Psychiatric:        Mood and Affect: Mood normal.        Behavior: Behavior normal.        Thought Content: Thought content normal.        Judgment: Judgment normal.     LABS:      Latest Ref Rng & Units 08/28/2023    9:03 AM 04/27/2023   11:07 AM 02/25/2023    9:05 AM  CBC  WBC 4.0 - 10.5 K/uL 3.9  4.1  3.9   Hemoglobin 12.0 - 15.0 g/dL 96.0  45.4  09.8   Hematocrit 36.0 - 46.0 % 35.3  42.0  42.9   Platelets 150 - 400 K/uL 284  195.0  179       Latest Ref Rng & Units 08/28/2023    9:03 AM 04/27/2023   11:07 AM 02/25/2023    9:05 AM  CMP  Glucose 70 - 99 mg/dL 92  87  92   BUN 8 - 23 mg/dL 16  14  18    Creatinine 0.44 - 1.00 mg/dL 1.19  1.47  8.29   Sodium 135 - 145 mmol/L 140  137  138    Potassium 3.5 - 5.1 mmol/L 4.0  3.9  4.1   Chloride 98 - 111 mmol/L 103  103  103   CO2 22 - 32 mmol/L 28  26  27    Calcium 8.9 - 10.3 mg/dL 9.4  9.5  9.3   Total Protein 6.5 - 8.1 g/dL 6.3  6.7  7.0   Total Bilirubin 0.0 - 1.2 mg/dL 0.3  0.6  0.6   Alkaline Phos 38 - 126 U/L 123  71  67   AST 15 - 41 U/L 30  17  18    ALT 0 - 44 U/L 30  15  18     Lab Results  Component Value Date   TSH 2.030 02/25/2023   Lab Results  Component Value Date   VITAMINB12 548 02/25/2023   Lab Results  Component Value Date   FOLATE 10.8 02/24/2022    STUDIES:  No studies found     Allergies:  Allergies  Allergen Reactions   Cephalosporins Hives, Itching, Rash and Swelling    Current Medications: Current Outpatient Medications  Medication Sig Dispense Refill   Cholecalciferol (VITAMIN D3) 10 MCG (400 UNIT) tablet Take 400 Units by mouth daily.     cyanocobalamin  (VITAMIN B12) 500 MCG tablet Take 500 mcg by mouth daily.     Magnesium 250 MG TABS Take by mouth.     meloxicam (MOBIC) 15 MG tablet Take 15 mg by mouth daily.     ZEPBOUND  10 MG/0.5ML injection vial INJECT 0.5 ML (10 MG) UNDER THE SKIN ONCE WEEKLY (0.5ML= 50 UNITS) 2 mL 0   No current facility-administered medications for this visit.     ASSESSMENT & PLAN:   Assessment:   1.  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  She has undergone lumpectomy with clear margins.  [facility] has not recommended further surgical excision.  She will be undergoing MRI breast and CT imaging yearly now for close follow up. This was done in March and was stable. Her annual mammograms will be due in October with Dr. Britta Candy.  2.  Strong family history of breast cancer. [facility] Multi Cancer Panel test was negative-no clinically significant mutation identified.  There was a variant of uncertain significance (VUS) found in the SMARCA4 gene.   3.  History of melanoma in situ of the lower right calf, diagnosed by Dr.  Sherryle Don, dermatology in [place].  This was treated with wide excision.  She follows up with dermatology routinely. She has a small keratosis of the right mid back where she previously ha dan excision of an lesion so we will monitor this.   4.  Multiple angiomyolipomas of bilateral kidneys on CT scan.  There is also an indeterminate 15 mm lesion arising from the posterior right kidney.  This may represent a lipid poor angiomyolipoma, but she is followed regularly with scans at [facility].  5.  Multiple benign lipomas of the skin, mainly involving the right upper extremity.  6.  Colon polyps and familial history of colon cancer.  She should have a repeat colonoscopy in the next 1-2 years.    7. Mild Transient Leukopenia. This most likely represents cyclic neutropenia since it goes up and down.   8. Mild anemia due to blood lose from surgery. I advised her to increase iron in her diet.   Plan: She had right knee replacement surgery on 08/06/2023. She will have repeat MRI of the breast and CT scan  done in March at [facility]. Patient notes since losing weight her back/shoulder pain has significantly improved. She has a low WBC of 3.9, a low hemoglobin of 11.9 down from 13.9, and a platelet count of 284,000. Her drop in hemoglobin is likely related to her recent surgery. I encouraged her to increase iron in her diet. Her CMP is normal other than a low total protein of 6.3. I will see her back in 6 months with CBC and CMP. She verbalizes understanding of and agreement to the plans discussed today. She knows to call the office should any new questions or concerns arise.   I provided 14 minutes of face-to-face time during this this encounter and > 50% was spent counseling as documented under my assessment and plan.   Nolia Baumgartner, MD  [facility] - A DEPT OF [facility] [address] [place] [address] Dept: [phone] Dept Fax:  [phone]   No orders of the defined types were placed in this encounter.   I,Jasmine M Lassiter,acting as a scribe for Nolia Baumgartner, MD.,have documented all relevant documentation on the behalf of Nolia Baumgartner, MD,as directed by  Nolia Baumgartner, MD while in the presence of Nolia Baumgartner, MD.

## 2023-08-31 DIAGNOSIS — M25561 Pain in right knee: Secondary | ICD-10-CM | POA: Diagnosis not present

## 2023-09-04 ENCOUNTER — Encounter: Payer: Self-pay | Admitting: Family Medicine

## 2023-09-04 DIAGNOSIS — M25561 Pain in right knee: Secondary | ICD-10-CM | POA: Diagnosis not present

## 2023-09-07 NOTE — Telephone Encounter (Signed)
 Please advise

## 2023-09-08 DIAGNOSIS — M25561 Pain in right knee: Secondary | ICD-10-CM | POA: Diagnosis not present

## 2023-09-08 NOTE — Telephone Encounter (Signed)
 Since she has been off for a month, we are going to have to restart at a lower dose. Recommend 5 mg weekly and recommend she follow up with [name] in a few weeks and we can quickly escalate back as tolerated.  Katina Degree. [name], MD 09/08/2023 7:55 AM

## 2023-09-09 ENCOUNTER — Other Ambulatory Visit: Payer: Self-pay | Admitting: *Deleted

## 2023-09-09 ENCOUNTER — Other Ambulatory Visit: Payer: Self-pay | Admitting: Family Medicine

## 2023-09-09 ENCOUNTER — Other Ambulatory Visit (HOSPITAL_COMMUNITY): Payer: Self-pay

## 2023-09-09 MED ORDER — TIRZEPATIDE-WEIGHT MANAGEMENT 5 MG/0.5ML ~~LOC~~ SOLN: 5.0000 mg | SUBCUTANEOUS | 0 refills | Status: DC

## 2023-09-09 NOTE — Telephone Encounter (Signed)
Please start PA thanks.

## 2023-09-10 ENCOUNTER — Other Ambulatory Visit (HOSPITAL_COMMUNITY): Payer: Self-pay

## 2023-09-10 ENCOUNTER — Telehealth: Payer: Self-pay

## 2023-09-10 NOTE — Telephone Encounter (Signed)
ERROR

## 2023-09-10 NOTE — Telephone Encounter (Signed)
 Pharmacy Patient Advocate Encounter   Received notification from RX Request Messages that prior authorization for Zepbound 5mg /0.53ml is required/requested.   Insurance verification completed.   The patient is insured through [facility] ADVANTAGE/RX ADVANCE .   Per test claim: PA required; PA submitted to above mentioned insurance via CoverMyMeds Key/confirmation #/EOC [id number] Status is pending

## 2023-09-11 DIAGNOSIS — M25561 Pain in right knee: Secondary | ICD-10-CM | POA: Diagnosis not present

## 2023-09-14 NOTE — Telephone Encounter (Signed)
 Pharmacy Patient Advocate Encounter  Received notification from [facility] ADVANTAGE/RX ADVANCE that Prior Authorization for ZEPBOUND 5MG /0.5ML has been DENIED.  Full denial letter will be uploaded to the media tab. See denial reason below.   PA #/Case ID/Reference #: [id number]

## 2023-09-15 DIAGNOSIS — M25561 Pain in right knee: Secondary | ICD-10-CM | POA: Diagnosis not present

## 2023-09-15 NOTE — Telephone Encounter (Signed)
 Her insurance approved this last year. Did her coverage change?  Kathy Joseph. [name], MD 09/15/2023 7:46 AM

## 2023-09-17 DIAGNOSIS — M25561 Pain in right knee: Secondary | ICD-10-CM | POA: Diagnosis not present

## 2023-09-17 NOTE — Telephone Encounter (Signed)
 Placed a call to [facility] to see if the patient was using the insurance to get the medication and the pharmacist stated it did not need a prior authorization and they were waiting on the medication to come in.

## 2023-09-18 ENCOUNTER — Other Ambulatory Visit: Payer: Self-pay | Admitting: *Deleted

## 2023-09-18 DIAGNOSIS — L820 Inflamed seborrheic keratosis: Secondary | ICD-10-CM | POA: Diagnosis not present

## 2023-09-18 DIAGNOSIS — D2239 Melanocytic nevi of other parts of face: Secondary | ICD-10-CM | POA: Diagnosis not present

## 2023-09-18 DIAGNOSIS — D225 Melanocytic nevi of trunk: Secondary | ICD-10-CM | POA: Diagnosis not present

## 2023-09-18 DIAGNOSIS — B009 Herpesviral infection, unspecified: Secondary | ICD-10-CM | POA: Diagnosis not present

## 2023-09-18 MED ORDER — TIRZEPATIDE-WEIGHT MANAGEMENT 5 MG/0.5ML ~~LOC~~ SOLN: 5.0000 mg | SUBCUTANEOUS | 0 refills | Status: DC

## 2023-09-18 NOTE — Telephone Encounter (Signed)
Rx was resend today

## 2023-09-21 ENCOUNTER — Other Ambulatory Visit (HOSPITAL_COMMUNITY): Payer: Self-pay

## 2023-09-22 DIAGNOSIS — M25561 Pain in right knee: Secondary | ICD-10-CM | POA: Diagnosis not present

## 2023-09-25 DIAGNOSIS — M25561 Pain in right knee: Secondary | ICD-10-CM | POA: Diagnosis not present

## 2023-09-29 DIAGNOSIS — M25561 Pain in right knee: Secondary | ICD-10-CM | POA: Diagnosis not present

## 2023-10-01 NOTE — Telephone Encounter (Signed)
 If she is doing well with the 5 mg weekly, then recommend that we increase to 7.5 mg weekly next.  It is okay to send this to [facility] direct.  Please send a new prescription.

## 2023-10-02 ENCOUNTER — Other Ambulatory Visit: Payer: Self-pay | Admitting: *Deleted

## 2023-10-02 DIAGNOSIS — M25561 Pain in right knee: Secondary | ICD-10-CM | POA: Diagnosis not present

## 2023-10-02 MED ORDER — TIRZEPATIDE-WEIGHT MANAGEMENT 7.5 MG/0.5ML ~~LOC~~ SOLN: 7.5000 mg | SUBCUTANEOUS | 1 refills | Status: DC

## 2023-11-04 ENCOUNTER — Encounter: Payer: Self-pay | Admitting: Family Medicine

## 2023-11-04 NOTE — Telephone Encounter (Signed)
**Note De-identified  [place] Obfuscation** Please advise 

## 2023-11-05 NOTE — Telephone Encounter (Signed)
 I appreciate the update. I am glad that she is doing well. Ok to send in 10 mg weekly dose and have her follow up with us  in a few weeks.  Jinny Mounts. Daneil Dunker, MD 11/05/2023 4:25 PM

## 2023-11-09 ENCOUNTER — Other Ambulatory Visit: Payer: Self-pay | Admitting: Family Medicine

## 2023-11-09 ENCOUNTER — Other Ambulatory Visit: Payer: Self-pay | Admitting: *Deleted

## 2023-11-09 MED ORDER — TIRZEPATIDE-WEIGHT MANAGEMENT 10 MG/0.5ML ~~LOC~~ SOLN
10.0000 mg | SUBCUTANEOUS | 0 refills | Status: DC
Start: 2023-11-09 — End: 2023-11-10

## 2023-11-10 ENCOUNTER — Other Ambulatory Visit: Payer: Self-pay | Admitting: *Deleted

## 2023-11-10 ENCOUNTER — Telehealth: Payer: Self-pay

## 2023-11-10 ENCOUNTER — Other Ambulatory Visit (HOSPITAL_COMMUNITY): Payer: Self-pay

## 2023-11-10 ENCOUNTER — Other Ambulatory Visit: Payer: Self-pay | Admitting: Family Medicine

## 2023-11-10 MED ORDER — TIRZEPATIDE-WEIGHT MANAGEMENT 10 MG/0.5ML ~~LOC~~ SOLN: 10.0000 mg | SUBCUTANEOUS | 0 refills | Status: DC

## 2023-11-10 NOTE — Telephone Encounter (Signed)
 Please start a PA for Rx Zepbound 

## 2023-11-10 NOTE — Telephone Encounter (Signed)
 PA request has been Submitted. New Encounter has been or will be created for follow up. For additional info see Pharmacy Prior Auth telephone encounter from 11-10-2023.

## 2023-11-10 NOTE — Telephone Encounter (Signed)
 Pharmacy Patient Advocate Encounter   Received notification from Patient Advice Request messages that prior authorization for Zebpound is required/requested.   Insurance verification completed.   The patient is insured through [facility] ADVANTAGE/RX ADVANCE .   Per test claim: PA required; PA submitted to above mentioned insurance via CoverMyMeds Key/confirmation #/EOC [id number] Status is pending

## 2023-11-10 NOTE — Telephone Encounter (Signed)
 Noted.

## 2023-11-11 NOTE — Telephone Encounter (Signed)
 Pharmacy Patient Advocate Encounter  Received notification from [facility] ADVANTAGE/RX ADVANCE that Prior Authorization for Zepbound  10MG /0.5ML pen-injectors has been DENIED.  Full denial letter will be uploaded to the media tab. See denial reason below.   PA #/Case ID/Reference #: [id number]

## 2023-11-11 NOTE — Telephone Encounter (Signed)
 Patient aware.

## 2023-11-26 ENCOUNTER — Other Ambulatory Visit: Payer: Self-pay | Admitting: Family Medicine

## 2023-11-27 ENCOUNTER — Telehealth (INDEPENDENT_AMBULATORY_CARE_PROVIDER_SITE_OTHER): Admitting: Family Medicine

## 2023-11-27 ENCOUNTER — Encounter: Payer: Self-pay | Admitting: Family Medicine

## 2023-11-27 VITALS — Ht 67.0 in | Wt 195.0 lb

## 2023-11-27 DIAGNOSIS — B001 Herpesviral vesicular dermatitis: Secondary | ICD-10-CM | POA: Insufficient documentation

## 2023-11-27 DIAGNOSIS — E669 Obesity, unspecified: Secondary | ICD-10-CM | POA: Diagnosis not present

## 2023-11-27 MED ORDER — VALACYCLOVIR HCL 1 G PO TABS: 2000.0000 mg | ORAL_TABLET | Freq: Two times a day (BID) | ORAL | 0 refills | Status: AC

## 2023-11-27 NOTE — Assessment & Plan Note (Signed)
 Uses Valtrex as needed for outbreaks.  Will refill today.  No current symptoms.

## 2023-11-27 NOTE — Progress Notes (Signed)
   Kathy Joseph is a 68 y.o. female who presents today for a virtual office visit.  Assessment/Plan:  Chronic Problems Addressed Today: Obesity She has continued to lose weight with Zepbound .  She is currently on 10 mg weekly and is doing well and is interested in increasingc the dose.  She does have some leftover 15 mg pens at home from a previous prescription and would like to transition of these if possible.  Advised patient it would be fine for her to jump from 10 to 15 initially though did discuss she may have more side effects with this.  She will follow-up with us  in a few weeks to let us  know how she is doing with 50 mg weekly.  If she has any issues would send in new prescription for 12.5 mg weekly.  She will come back here for in person office visit in about 3 months.  Cold sore Uses Valtrex as needed for outbreaks.  Will refill today.  No current symptoms.    Subjective:  HPI:  See A/P for status of chronic conditions.  Patient is here today to discuss Zepbound . She is currently on 10 mg weekly.  She is doing well at this dose and is interested in increasing to the next dose.  She does have some leftover 15 mg autoinjectors and would like to transition to this if possible.  She has continued to lose weight with the Zepbound .  She is having mild reflux but this is manageable.       Objective/Observations  Physical Exam: Gen: NAD, resting comfortably Pulm: Normal work of breathing Neuro: Grossly normal, moves all extremities Psych: Normal affect and thought content  Virtual Visit via Video   I connected with Motunrayo Seawell on 11/27/23 at 10:20 AM EDT by a video enabled telemedicine application and verified that I am speaking with the correct person using two identifiers. The limitations of evaluation and management by telemedicine and the availability of in person appointments were discussed. The patient expressed understanding and agreed to proceed.   Patient location:  Home Provider location: [place] [employer] Persons participating in the virtual visit: Myself and Patient     Jinny Mounts. Daneil Dunker, MD 11/27/2023 10:54 AM

## 2023-11-27 NOTE — Assessment & Plan Note (Signed)
 She has continued to lose weight with Zepbound .  She is currently on 10 mg weekly and is doing well and is interested in increasingc the dose.  She does have some leftover 15 mg pens at home from a previous prescription and would like to transition of these if possible.  Advised patient it would be fine for her to jump from 10 to 15 initially though did discuss she may have more side effects with this.  She will follow-up with us  in a few weeks to let us  know how she is doing with 50 mg weekly.  If she has any issues would send in new prescription for 12.5 mg weekly.  She will come back here for in person office visit in about 3 months.

## 2023-11-27 NOTE — Telephone Encounter (Signed)
 Please send refills for Zepbound  Vials if applicable

## 2023-12-25 ENCOUNTER — Encounter: Payer: Self-pay | Admitting: Oncology

## 2023-12-30 ENCOUNTER — Encounter: Payer: Self-pay | Admitting: Family Medicine

## 2023-12-31 DIAGNOSIS — Z853 Personal history of malignant neoplasm of breast: Secondary | ICD-10-CM | POA: Diagnosis not present

## 2023-12-31 DIAGNOSIS — Z1239 Encounter for other screening for malignant neoplasm of breast: Secondary | ICD-10-CM | POA: Diagnosis not present

## 2023-12-31 DIAGNOSIS — Z1231 Encounter for screening mammogram for malignant neoplasm of breast: Secondary | ICD-10-CM | POA: Diagnosis not present

## 2023-12-31 DIAGNOSIS — C50411 Malignant neoplasm of upper-outer quadrant of right female breast: Secondary | ICD-10-CM | POA: Diagnosis not present

## 2023-12-31 DIAGNOSIS — C50919 Malignant neoplasm of unspecified site of unspecified female breast: Secondary | ICD-10-CM | POA: Diagnosis not present

## 2023-12-31 MED ORDER — TIRZEPATIDE-WEIGHT MANAGEMENT 15 MG/0.5ML ~~LOC~~ SOAJ: 15.0000 mg | SUBCUTANEOUS | 3 refills | Status: DC

## 2023-12-31 NOTE — Telephone Encounter (Signed)
**Note De-identified  [place] Obfuscation** Please advise 

## 2024-01-01 ENCOUNTER — Other Ambulatory Visit (HOSPITAL_COMMUNITY): Payer: Self-pay

## 2024-01-01 ENCOUNTER — Other Ambulatory Visit: Payer: Self-pay | Admitting: Family Medicine

## 2024-01-01 NOTE — Telephone Encounter (Signed)
Please start PA thanks.

## 2024-01-13 DIAGNOSIS — C50911 Malignant neoplasm of unspecified site of right female breast: Secondary | ICD-10-CM | POA: Diagnosis not present

## 2024-01-25 ENCOUNTER — Other Ambulatory Visit: Payer: Self-pay | Admitting: Family Medicine

## 2024-01-25 ENCOUNTER — Telehealth: Admitting: Physician Assistant

## 2024-01-25 DIAGNOSIS — Z760 Encounter for issue of repeat prescription: Secondary | ICD-10-CM

## 2024-01-25 MED ORDER — ZEPBOUND 15 MG/0.5ML ~~LOC~~ SOLN
15.0000 mg | SUBCUTANEOUS | 1 refills | Status: DC
Start: 2024-01-25 — End: 2024-04-06

## 2024-01-25 NOTE — Progress Notes (Signed)
 Patient needing Zepbound  prescription changed to [facility] pharmacy. Advised to follow up with PCP for pharmacy change. She voiced understanding and will message her PCP.

## 2024-01-25 NOTE — Patient Instructions (Signed)
  Kathy Joseph, thank you for joining Delon [name], PA-C for today's virtual visit.  While this provider is not your primary care provider (PCP), if your PCP is located in our provider database this encounter information will be shared with them immediately following your visit.   A [facility] MyChart account gives you access to today's visit and all your visits, tests, and labs performed at [facility]  click here if you don't have a [facility] MyChart account or go to [email or web address]  Consent: (Patient) Kathy Joseph provided verbal consent for this virtual visit at the beginning of the encounter.  Current Medications:  Current Outpatient Medications:    Cholecalciferol (VITAMIN D3) 10 MCG (400 UNIT) tablet, Take 400 Units by mouth daily., Disp: , Rfl:    cyanocobalamin  (VITAMIN B12) 500 MCG tablet, Take 500 mcg by mouth daily., Disp: , Rfl:    Magnesium 250 MG TABS, Take by mouth., Disp: , Rfl:    meloxicam (MOBIC) 15 MG tablet, Take 15 mg by mouth daily., Disp: , Rfl:    ZEPBOUND  15 MG/0.5ML Pen, INJECT 15 MG INTO THE SKIN ONCE A WEEK., Disp: 6 mL, Rfl: 3   Medications ordered in this encounter:  No orders of the defined types were placed in this encounter.    *If you need refills on other medications prior to your next appointment, please contact your pharmacy*  Follow-Up: Call back or seek an in-person evaluation if the symptoms worsen or if the condition fails to improve as anticipated.  [facility] [phone]  Other Instructions Call PCP to have medication changed.  Visit No charged.   If you have been instructed to have an in-person evaluation today at a local Urgent Care facility, please use the link below. It will take you to a list of all of our available [facility], including address, phone number and hours of operation. Please do not delay care.  [facility]  If you or a family member  do not have a primary care provider, use the link below to schedule a visit and establish care. When you choose a [facility] primary care physician or advanced practice provider, you gain a long-term partner in health. Find a Primary Care Provider  Learn more about [facility]'s in-office and virtual care options:  - Get Care Now

## 2024-01-25 NOTE — Telephone Encounter (Signed)
 Copied from CRM [id number]. Topic: Clinical - Medication Refill >> Jan 25, 2024 10:26 AM Thersia BROCKS wrote: Medication: ZEPBOUND  15 MG/0.5ML Pen  Has the patient contacted their pharmacy? Yes (Agent: If no, request that the patient contact the pharmacy for the refill. If patient does not wish to contact the pharmacy document the reason why and proceed with request.) (Agent: If yes, when and what did the pharmacy advise?)  This is the patient's preferred pharmacy:   [facility] [place], [place] - [address] [phone] [address] [place] [address] Phone: [phone] Fax: [phone]  Is this the correct pharmacy for this prescription? Yes If no, delete pharmacy and type the correct one.   Has the prescription been filled recently? No  Is the patient out of the medication? Yes  Has the patient been seen for an appointment in the last year OR does the patient have an upcoming appointment? Yes  Can we respond through MyChart? Yes  Agent: Please be advised that Rx refills may take up to 3 business days. We ask that you follow-up with your pharmacy.

## 2024-02-25 ENCOUNTER — Ambulatory Visit: Payer: PPO | Admitting: Hematology and Oncology

## 2024-02-25 ENCOUNTER — Other Ambulatory Visit: Payer: PPO

## 2024-03-03 ENCOUNTER — Inpatient Hospital Stay: Admitting: Hematology and Oncology

## 2024-03-03 ENCOUNTER — Inpatient Hospital Stay: Attending: Hematology and Oncology

## 2024-03-03 ENCOUNTER — Telehealth: Payer: Self-pay | Admitting: Oncology

## 2024-03-03 VITALS — BP 123/74 | HR 71 | Temp 97.5°F | Resp 14 | Ht 67.0 in | Wt 187.5 lb

## 2024-03-03 DIAGNOSIS — Z85820 Personal history of malignant melanoma of skin: Secondary | ICD-10-CM | POA: Insufficient documentation

## 2024-03-03 DIAGNOSIS — D1721 Benign lipomatous neoplasm of skin and subcutaneous tissue of right arm: Secondary | ICD-10-CM | POA: Insufficient documentation

## 2024-03-03 DIAGNOSIS — D72819 Decreased white blood cell count, unspecified: Secondary | ICD-10-CM | POA: Insufficient documentation

## 2024-03-03 DIAGNOSIS — D649 Anemia, unspecified: Secondary | ICD-10-CM | POA: Diagnosis not present

## 2024-03-03 DIAGNOSIS — Z800 Family history of malignant neoplasm of digestive organs: Secondary | ICD-10-CM | POA: Diagnosis not present

## 2024-03-03 DIAGNOSIS — Z803 Family history of malignant neoplasm of breast: Secondary | ICD-10-CM | POA: Insufficient documentation

## 2024-03-03 DIAGNOSIS — C50919 Malignant neoplasm of unspecified site of unspecified female breast: Secondary | ICD-10-CM

## 2024-03-03 DIAGNOSIS — Z85831 Personal history of malignant neoplasm of soft tissue: Secondary | ICD-10-CM | POA: Diagnosis not present

## 2024-03-03 DIAGNOSIS — Z08 Encounter for follow-up examination after completed treatment for malignant neoplasm: Secondary | ICD-10-CM | POA: Insufficient documentation

## 2024-03-03 LAB — CBC WITH DIFFERENTIAL (CANCER CENTER ONLY)
Abs Immature Granulocytes: 0 K/uL (ref 0.00–0.07)
Basophils Absolute: 0 K/uL (ref 0.0–0.1)
Basophils Relative: 1 %
Eosinophils Absolute: 0.1 K/uL (ref 0.0–0.5)
Eosinophils Relative: 4 %
HCT: 40 % (ref 36.0–46.0)
Hemoglobin: 13.7 g/dL (ref 12.0–15.0)
Immature Granulocytes: 0 %
Lymphocytes Relative: 34 %
Lymphs Abs: 1.1 K/uL (ref 0.7–4.0)
MCH: 31.6 pg (ref 26.0–34.0)
MCHC: 34.3 g/dL (ref 30.0–36.0)
MCV: 92.4 fL (ref 80.0–100.0)
Monocytes Absolute: 0.3 K/uL (ref 0.1–1.0)
Monocytes Relative: 9 %
Neutro Abs: 1.7 K/uL (ref 1.7–7.7)
Neutrophils Relative %: 52 %
Platelet Count: 163 K/uL (ref 150–400)
RBC: 4.33 MIL/uL (ref 3.87–5.11)
RDW: 10.9 % — ABNORMAL LOW (ref 11.5–15.5)
WBC Count: 3.2 K/uL — ABNORMAL LOW (ref 4.0–10.5)
nRBC: 0 % (ref 0.0–0.2)

## 2024-03-03 LAB — CMP (CANCER CENTER ONLY)
ALT: 14 U/L (ref 0–44)
AST: 21 U/L (ref 15–41)
Albumin: 4.1 g/dL (ref 3.5–5.0)
Alkaline Phosphatase: 85 U/L (ref 38–126)
Anion gap: 12 (ref 5–15)
BUN: 12 mg/dL (ref 8–23)
CO2: 23 mmol/L (ref 22–32)
Calcium: 9.3 mg/dL (ref 8.9–10.3)
Chloride: 104 mmol/L (ref 98–111)
Creatinine: 0.83 mg/dL (ref 0.44–1.00)
GFR, Estimated: >60 mL/min (ref >60)
Glucose, Bld: 83 mg/dL (ref 70–99)
Potassium: 4.2 mmol/L (ref 3.5–5.1)
Sodium: 138 mmol/L (ref 135–145)
Total Bilirubin: 0.6 mg/dL (ref 0.0–1.2)
Total Protein: 6.6 g/dL (ref 6.5–8.1)

## 2024-03-03 NOTE — Telephone Encounter (Signed)
 Patient has been scheduled for follow-up visit per 03/03/24 LOS.  Pt aware of scheduled appt details.

## 2024-03-03 NOTE — Progress Notes (Signed)
 [facility]  [address] [place],  [place]  [address] [phone]  Clinic Day: 08/28/23  Referring physician: Kennyth Worth HERO, MD   CHIEF COMPLAINT:  CC: History of mammary angiosarcoma   Current Treatment:  Surveillance  HISTORY OF PRESENT ILLNESS:  Kathy Joseph is a 68 y.o. female with mammary angiosarcoma of the right breast, diagnosed in August 2021.  This was found on a screening mammogram in November 2020, where she was found to have possible asymmetry in the right breast.  Diagnostic right mammogram and ultrasound revealed the asymmetry to persist on additional views, but ultrasound did not reveal any suspicious masses.  There were areas of focal fibroglandular tissue that may correspond to the abnormality.  Six-month follow-up right diagnostic mammogram and ultrasound were recommended.  In May 2021, there was an increase in the focal asymmetry in the right outer breast.  Ultrasound revealed an irregular mass at 8 o'clock 6 cm from the nipple measuring 1.9 x 1.7 x 1.1 cm.  No enlarged axillary lymph nodes were seen.  Biopsy revealed an angiolipoma with a differential diagnosis of fat necrosis versus malignancy.  The radiologist felt the findings were concordant, but as he was still concerned about the appearance on ultrasound, excision was recommended, so she was referred to Dr. Joesph.  She underwent needle localized lumpectomy on August 21st and surgical pathology from this procedure revealed mammary angiosarcoma.  These results were sent to [facility], and confirmed.  Since even low grade lesions of this type have a substantial risk of distant metastasis, tumors of this type are generally treated by mastectomy.  CT chest from September 1st revealed no evidence of metastatic disease in the chest.  Bilateral calcified pulmonary nodules are consistent with prior granulomatous disease.  There is a 9 mm subpleural nodule in the left upper lobe with curvilinear calcifications.   Regardless this appears benign.  Multiple fat density lesions involving both kidneys are consistent with multiple angiomyolipomas.  There is also an indeterminate 15 mm lesion arising from the posterior right kidney, which may represent a lipid poor angiomyolipoma.  Further characterization with renal protocol MRI was recommended.  Hypodense lesion in the left lobe of the thyroid  gland measuring 12 mm.  Given size, this is not clinically significant.  She has been seen at [facility] in consultation and will be returning for their recommendations.  She started menarche at age 53.  She has had 4 pregnancies, with 3 live births.  Her 1st live birth was at age 72.  She was placed on tamoxifen as chemoprevention for 2-3 years, but this was discontinued after she developed a superficial venous thrombosis of the left leg.  She took oral contraceptives for 9 years after her last child.  The professors at [facility] reviewed the surgical pathology from her procedure, and they did not recommend any further excision at this time as her margins were clear.  The closest margin was 1.5 mm.  They did recommend close follow up with breast MRI and CT imaging every 6 months for the next 2 years. CT and MRI imaging from March 2022 at [facility] are benign.   INTERVAL HISTORY:  Kathy Joseph is here for routine follow up for history of mammary angiosarcoma.  She was also found to have a splenic aneurysm. Patient states that she feels well but complains of right knee pain rating 3/10 as she had right knee replacement surgery on 08/06/2023. She will have repeat MRI of the breast and CT scan  done in March at [facility]. Patient notes since losing weight her back/shoulder pain has significantly improved. She has a low WBC of 3.9, a low hemoglobin of 11.9 down from 13.9, and a platelet count of 284,000. Her drop in hemoglobin is likely related to her recent surgery.  I encouraged her to increase iron in her diet. Her CMP is normal other than a  low total protein of 6.3. I will see her back in 6 months with CBC and CMP.   She denies signs of infection such as sore throat, sinus drainage, cough, or urinary symptoms.  She denies fevers or recurrent chills.  She denies nausea, vomiting, chest pain, dyspnea or cough. Her appetite is good and her weight has decreased 6 pounds over last 4 months.   REVIEW OF SYSTEMS:   Review of Systems  Constitutional: Negative.  Negative for appetite change, chills, diaphoresis, fatigue, fever and unexpected weight change.  HENT:  Negative.  Negative for hearing loss, lump/mass, mouth sores, nosebleeds, sore throat, tinnitus, trouble swallowing and voice change.   Eyes: Negative.  Negative for eye problems and icterus.  Respiratory: Negative.  Negative for chest tightness, cough, hemoptysis, shortness of breath and wheezing.   Cardiovascular: Negative.  Negative for chest pain, leg swelling and palpitations.  Gastrointestinal: Negative.  Negative for abdominal distention, abdominal pain, blood in stool, constipation, diarrhea, nausea, rectal pain and vomiting.  Endocrine: Negative.   Genitourinary: Negative.  Negative for bladder incontinence, difficulty urinating, dyspareunia, dysuria, frequency, hematuria, menstrual problem, nocturia, pelvic pain, vaginal bleeding and vaginal discharge.   Musculoskeletal:  Positive for arthralgias. Negative for back pain, flank pain, gait problem, myalgias, neck pain and neck stiffness.       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  Right knee pain and swelling rating 3/10 due to knee replacement surgery  Skin: Negative.  Negative for itching, rash and wound.  Neurological: Negative.  Negative for dizziness, extremity weakness, gait problem, headaches, light-headedness, numbness, seizures and speech difficulty.  Hematological: Negative.  Negative for adenopathy. Does not bruise/bleed easily.  Psychiatric/Behavioral: Negative.  Negative for confusion, decreased concentration,  depression, sleep disturbance and suicidal ideas. The patient is not nervous/anxious.      VITALS:  Blood pressure 123/74, pulse 71, temperature (!) 97.5 F (36.4 C), temperature source Oral, resp. rate 14, height 5' 7 (1.702 m), weight 187 lb 8 oz (85 kg), SpO2 99%.  Wt Readings from Last 3 Encounters:  03/03/24 187 lb 8 oz (85 kg)  11/27/23 195 lb (88.5 kg)  08/28/23 198 lb 14.4 oz (90.2 kg)    Body mass index is 29.37 kg/m.  Performance status (ECOG): 0 - Asymptomatic  PHYSICAL EXAM:  Physical Exam Vitals and nursing note reviewed.  Constitutional:      General: She is not in acute distress.    Appearance: Normal appearance. She is normal weight. She is not ill-appearing, toxic-appearing or diaphoretic.  HENT:     Head: Normocephalic and atraumatic.     Right Ear: Tympanic membrane, ear canal and external ear normal. There is no impacted cerumen.     Left Ear: Tympanic membrane, ear canal and external ear normal. There is no impacted cerumen.     Nose: Nose normal. No congestion or rhinorrhea.     Mouth/Throat:     Mouth: Mucous membranes are moist.     Pharynx: Oropharynx is clear. No oropharyngeal exudate or posterior oropharyngeal erythema.  Eyes:     General: No scleral icterus.  Right eye: No discharge.        Left eye: No discharge.     Extraocular Movements: Extraocular movements intact.     Conjunctiva/sclera: Conjunctivae normal.     Pupils: Pupils are equal, round, and reactive to light.  Neck:     Vascular: No carotid bruit.  Cardiovascular:     Rate and Rhythm: Normal rate and regular rhythm.     Pulses: Normal pulses.     Heart sounds: Normal heart sounds. No murmur heard.    No friction rub. No gallop.  Pulmonary:     Effort: Pulmonary effort is normal. No respiratory distress.     Breath sounds: Normal breath sounds. No stridor. No wheezing, rhonchi or rales.  Chest:     Chest wall: No tenderness.     Comments: No masses in either  breast.  Abdominal:     General: Bowel sounds are normal. There is no distension.     Palpations: Abdomen is soft. There is no hepatomegaly, splenomegaly or mass.     Tenderness: There is no abdominal tenderness. There is no right CVA tenderness, left CVA tenderness, guarding or rebound.     Hernia: No hernia is present.  Musculoskeletal:        General: No swelling, tenderness, deformity or signs of injury. Normal range of motion.     Cervical back: Normal range of motion and neck supple. No rigidity or tenderness.     Right lower leg: No edema.     Left lower leg: No edema.     Comments: No spinal tenderness but slight scoliosis.   Lymphadenopathy:     Cervical: No cervical adenopathy.  Skin:    General: Skin is warm and dry.     Coloration: Skin is not jaundiced or pale.     Findings: No bruising, erythema, lesion or rash.  Neurological:     General: No focal deficit present.     Mental Status: She is alert and oriented to person, place, and time. Mental status is at baseline.     Cranial Nerves: No cranial nerve deficit.     Sensory: No sensory deficit.     Motor: No weakness.     Coordination: Coordination normal.     Gait: Gait normal.     Deep Tendon Reflexes: Reflexes normal.  Psychiatric:        Mood and Affect: Mood normal.        Behavior: Behavior normal.        Thought Content: Thought content normal.        Judgment: Judgment normal.     LABS:      Latest Ref Rng & Units 03/03/2024    9:35 AM 08/28/2023    9:03 AM 04/27/2023   11:07 AM  CBC  WBC 4.0 - 10.5 K/uL 3.2  3.9  4.1   Hemoglobin 12.0 - 15.0 g/dL 86.2  88.0  86.0   Hematocrit 36.0 - 46.0 % 40.0  35.3  42.0   Platelets 150 - 400 K/uL 163  284  195.0       Latest Ref Rng & Units 03/03/2024    9:35 AM 08/28/2023    9:03 AM 04/27/2023   11:07 AM  CMP  Glucose 70 - 99 mg/dL 83  92  87   BUN 8 - 23 mg/dL 12  16  14    Creatinine 0.44 - 1.00 mg/dL 9.16  9.26  9.18   Sodium 135 - 145 mmol/L 138  140  137    Potassium 3.5 - 5.1 mmol/L 4.2  4.0  3.9   Chloride 98 - 111 mmol/L 104  103  103   CO2 22 - 32 mmol/L 23  28  26    Calcium 8.9 - 10.3 mg/dL 9.3  9.4  9.5   Total Protein 6.5 - 8.1 g/dL 6.6  6.3  6.7   Total Bilirubin 0.0 - 1.2 mg/dL 0.6  0.3  0.6   Alkaline Phos 38 - 126 U/L 85  123  71   AST 15 - 41 U/L 21  30  17    ALT 0 - 44 U/L 14  30  15     Lab Results  Component Value Date   TSH 2.030 02/25/2023   Lab Results  Component Value Date   VITAMINB12 548 02/25/2023   Lab Results  Component Value Date   FOLATE 10.8 02/24/2022    STUDIES:  No studies found     Allergies:  Allergies  Allergen Reactions   Cephalosporins Hives, Itching, Rash and Swelling    Current Medications: Current Outpatient Medications  Medication Sig Dispense Refill   Cholecalciferol (VITAMIN D3) 10 MCG (400 UNIT) tablet Take 400 Units by mouth daily.     cyanocobalamin  (VITAMIN B12) 500 MCG tablet Take 500 mcg by mouth daily.     Magnesium 250 MG TABS Take by mouth.     meloxicam (MOBIC) 15 MG tablet Take 15 mg by mouth daily.     Tirzepatide -Weight Management (ZEPBOUND ) 15 MG/0.5ML SOLN Inject 15 mg into the skin once a week. 2 mL 1   No current facility-administered medications for this visit.     ASSESSMENT & PLAN:   Assessment:   1.  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  She has undergone lumpectomy with clear margins.  [facility] has not recommended further surgical excision.  She will be undergoing MRI breast and CT imaging yearly now for close follow up. This was done in March and was stable. Her annual mammograms will be due in October with Dr. Joesph.  2.  Strong family history of breast cancer. [facility] Multi Cancer Panel test was negative-no clinically significant mutation identified.  There was a variant of uncertain significance (VUS) found in the SMARCA4 gene.   3.  History of melanoma in situ of the lower right calf, diagnosed by Dr. Sherree,  dermatology in [place].  This was treated with wide excision.  She follows up with dermatology routinely. She has a small keratosis of the right mid back where she previously ha dan excision of an lesion so we will monitor this.   4.  Multiple angiomyolipomas of bilateral kidneys on CT scan.  There is also an indeterminate 15 mm lesion arising from the posterior right kidney.  This may represent a lipid poor angiomyolipoma, but she is followed regularly with scans at [facility].  5.  Multiple benign lipomas of the skin, mainly involving the right upper extremity.  6.  Colon polyps and familial history of colon cancer.  She should have a repeat colonoscopy in the next 1-2 years.    7. Mild Transient Leukopenia. This most likely represents cyclic neutropenia since it goes up and down.   8. Mild anemia due to blood lose from surgery. I advised her to increase iron in her diet. This has improved.  Plan: She continues to have imaging with [facility] and follow up with us  as well as Dr. Joesph. She is doing  well and we will plan for return visit in 6 months.   I provided 14 minutes of face-to-face time during this this encounter and > 50% was spent counseling as documented under my assessment and plan.   Eleanor Bach, FNP- [facility] - A DEPT OF [place]. [facility] [address] [place] [address] Dept: [phone] Dept Fax: [phone]

## 2024-03-18 ENCOUNTER — Telehealth: Payer: Self-pay | Admitting: *Deleted

## 2024-03-18 NOTE — Telephone Encounter (Signed)
 error

## 2024-03-18 NOTE — Telephone Encounter (Signed)
 Copied from CRM #[id number]. Topic: General - Other >> Mar 17, 2024 10:44 AM Franky GRADE wrote: Reason for CRM: Patient is calling to update vaccines as she received the flu shot at her local pharmacy today.   Immunization records updated  [employer]

## 2024-04-04 ENCOUNTER — Encounter: Payer: Self-pay | Admitting: Family Medicine

## 2024-04-04 ENCOUNTER — Ambulatory Visit (INDEPENDENT_AMBULATORY_CARE_PROVIDER_SITE_OTHER): Payer: PPO

## 2024-04-04 VITALS — Ht 66.5 in | Wt 180.0 lb

## 2024-04-04 DIAGNOSIS — Z0000 Encounter for general adult medical examination without abnormal findings: Secondary | ICD-10-CM | POA: Diagnosis not present

## 2024-04-04 DIAGNOSIS — H25813 Combined forms of age-related cataract, bilateral: Secondary | ICD-10-CM | POA: Diagnosis not present

## 2024-04-04 DIAGNOSIS — H35363 Drusen (degenerative) of macula, bilateral: Secondary | ICD-10-CM | POA: Diagnosis not present

## 2024-04-04 DIAGNOSIS — H524 Presbyopia: Secondary | ICD-10-CM | POA: Diagnosis not present

## 2024-04-04 NOTE — Patient Instructions (Signed)
 Kathy Joseph,  Thank you for taking the time for your Medicare Wellness Visit. I appreciate your continued commitment to your health goals. Please review the care plan we discussed, and feel free to reach out if I can assist you further.  Medicare recommends these wellness visits once per year to help you and your care team stay ahead of potential health issues. These visits are designed to focus on prevention, allowing your provider to concentrate on managing your acute and chronic conditions during your regular appointments.  Please note that Annual Wellness Visits do not include a physical exam. Some assessments may be limited, especially if the visit was conducted virtually. If needed, we may recommend a separate in-person follow-up with your provider.  Ongoing Care Seeing your primary care provider every 3 to 6 months helps us  monitor your health and provide consistent, personalized care.   Referrals If a referral was made during today's visit and you haven't received any updates within two weeks, please contact the referred provider directly to check on the status.  Recommended Screenings:  Health Maintenance  Topic Date Due   DTaP/Tdap/Td vaccine (1 - Tdap) Never done   COVID-19 Vaccine (6 - 2025-26 season) 03/21/2024   DEXA scan (bone density measurement)  09/17/2024   Medicare Annual Wellness Visit  04/04/2025   Colon Cancer Screening  01/27/2027   Pneumococcal Vaccine for age over 12  Completed   Flu Shot  Completed   Hepatitis C Screening  Completed   Zoster (Shingles) Vaccine  Completed   HPV Vaccine  Aged Out   Meningitis B Vaccine  Aged Out   Breast Cancer Screening  Discontinued       04/04/2024    1:37 PM  Advanced Directives  Does Patient Have a Medical Advance Directive? Yes  Type of Estate agent of [place];Living will  Copy of Healthcare Power of Attorney in Chart? No - copy requested   Advance Care Planning is important because  it: Ensures you receive medical care that aligns with your values, goals, and preferences. Provides guidance to your family and loved ones, reducing the emotional burden of decision-making during critical moments.  Vision: Annual vision screenings are recommended for early detection of glaucoma, cataracts, and diabetic retinopathy. These exams can also reveal signs of chronic conditions such as diabetes and high blood pressure.  Dental: Annual dental screenings help detect early signs of oral cancer, gum disease, and other conditions linked to overall health, including heart disease and diabetes.  Please see the attached documents for additional preventive care recommendations.

## 2024-04-04 NOTE — Telephone Encounter (Signed)
 Can we have her come in to discuss?

## 2024-04-04 NOTE — Telephone Encounter (Signed)
**Note De-identified  [place] Obfuscation** Please advise 

## 2024-04-04 NOTE — Progress Notes (Signed)
 Subjective:   Kathy Joseph is a 68 y.o. who presents for a Medicare Wellness preventive visit.  As a reminder, Annual Wellness Visits don't include a physical exam, and some assessments may be limited, especially if this visit is performed virtually. We may recommend an in-person follow-up visit with your provider if needed.  Visit Complete: Virtual I connected with  Kathy Joseph on 04/04/24 by a audio enabled telemedicine application and verified that I am speaking with the correct person using two identifiers.  Patient Location: Home  Provider Location: Office/Clinic  I discussed the limitations of evaluation and management by telemedicine. The patient expressed understanding and agreed to proceed.  Vital Signs: Because this visit was a virtual/telehealth visit, some criteria may be missing or patient reported. Any vitals not documented were not able to be obtained and vitals that have been documented are patient reported.  VideoDeclined- This patient declined Librarian, academic. Therefore the visit was completed with audio only.  Persons Participating in Visit: Patient.  AWV Questionnaire: No: Patient Medicare AWV questionnaire was not completed prior to this visit.  Cardiac Risk Factors include: advanced age (>75men, >72 women);dyslipidemia     Objective:    Today's Vitals   04/04/24 1333  Weight: 180 lb (81.6 kg)  Height: 5' 6.5 (1.689 m)   Body mass index is 28.62 kg/m.     04/04/2024    1:37 PM 03/03/2024   10:04 AM 04/02/2023    1:40 PM 04/14/2022    9:40 AM 04/01/2021    9:39 AM 03/05/2021    1:41 PM 08/02/2020    2:55 PM  Advanced Directives  Does Patient Have a Medical Advance Directive? Yes Yes No Yes No Yes No  Type of Estate agent of [place];Living will   Healthcare Power of [place];Living will     Does patient want to make changes to medical advance directive?      Yes (ED - Information included in  AVS)   Copy of Healthcare Power of Attorney in Chart? No - copy requested   No - copy requested     Would patient like information on creating a medical advance directive?   No - Patient declined  Yes (MAU/Ambulatory/Procedural Areas - Information given)  No - Patient declined    Current Medications (verified) Outpatient Encounter Medications as of 04/04/2024  Medication Sig   Cholecalciferol (VITAMIN D3) 10 MCG (400 UNIT) tablet Take 400 Units by mouth daily.   cyanocobalamin  (VITAMIN B12) 500 MCG tablet Take 500 mcg by mouth daily.   Magnesium 250 MG TABS Take by mouth.   meloxicam (MOBIC) 15 MG tablet Take 15 mg by mouth daily.   Tirzepatide -Weight Management (ZEPBOUND ) 15 MG/0.5ML SOLN Inject 15 mg into the skin once a week.   No facility-administered encounter medications on file as of 04/04/2024.    Allergies (verified) Cephalosporins   History: Past Medical History:  Diagnosis Date   Arthritis    Atypical nevus 06/11/2018   below left ear, moderate atypia   Atypical nevus 06/11/2018   right mid forehead, moderate to severe (wider shave done 07/08/2018)   Melanoma (HCC) 01/20/2019   melanoma in situ on right distal calf TX on 07/29/202   Varicose veins of lower extremity    Past Surgical History:  Procedure Laterality Date   ECTOPIC PREGNANCY SURGERY  1986   KNEE SURGERY Right    TONSILLECTOMY AND ADENOIDECTOMY     Family History  Problem Relation Age of Onset  Breast cancer Mother    Colon cancer Father        Diagnosed in 29s.    Breast cancer Sister    Breast cancer Paternal Aunt    Breast cancer Maternal Aunt    Social History   Socioeconomic History   Marital status: Married    Spouse name: Not on file   Number of children: Not on file   Years of education: Not on file   Highest education level: Not on file  Occupational History   Occupation: Retired  Tobacco Use   Smoking status: Never    Passive exposure: Never   Smokeless tobacco: Never   Vaping Use   Vaping status: Never Used  Substance and Sexual Activity   Alcohol use: Yes    Alcohol/week: 3.0 standard drinks of alcohol    Types: 3 Glasses of wine per week   Drug use: Never   Sexual activity: Yes    Partners: Male  Other Topics Concern   Not on file  Social History Narrative   Not on file   Social Drivers of Health   Financial Resource Strain: Low Risk  (04/04/2024)   Overall Financial Resource Strain (CARDIA)    Difficulty of Paying Living Expenses: Not hard at all  Food Insecurity: No Food Insecurity (04/04/2024)   Hunger Vital Sign    Worried About Running Out of Food in the Last Year: Never true    Ran Out of Food in the Last Year: Never true  Transportation Needs: No Transportation Needs (04/04/2024)   PRAPARE - Administrator, Civil Service (Medical): No    Lack of Transportation (Non-Medical): No  Physical Activity: Insufficiently Active (04/04/2024)   Exercise Vital Sign    Days of Exercise per Week: 3 days    Minutes of Exercise per Session: 20 min  Stress: No Stress Concern Present (04/04/2024)   [employer] of Occupational Health - Occupational Stress Questionnaire    Feeling of Stress: Not at all  Social Connections: Socially Integrated (04/04/2024)   Social Connection and Isolation Panel    Frequency of Communication with Friends and Family: More than three times a week    Frequency of Social Gatherings with Friends and Family: More than three times a week    Attends Religious Services: More than 4 times per year    Active Member of [employer] or Organizations: Yes    Attends Banker Meetings: 1 to 4 times per year    Marital Status: Married    Tobacco Counseling Counseling given: Not Answered    Clinical Intake:  Pre-visit preparation completed: Yes  Pain : No/denies pain     BMI - recorded: 28.62 Nutritional Status: BMI 25 -29 Overweight Nutritional Risks: None Diabetes: No  Lab Results  Component  Value Date   HGBA1C 5.3 04/27/2023   HGBA1C 5.5 11/18/2021     How often do you need to have someone help you when you read instructions, pamphlets, or other written materials from your doctor or pharmacy?: 1 - Never  Interpreter Needed?: No  Information entered by :: Ellouise Haws, LPN   Activities of Daily Living     04/04/2024    1:35 PM  In your present state of health, do you have any difficulty performing the following activities:  Hearing? 0  Vision? 0  Difficulty concentrating or making decisions? 0  Walking or climbing stairs? 0  Dressing or bathing? 0  Doing errands, shopping? 0  Preparing Food and  eating ? N  Using the Toilet? N  In the past six months, have you accidently leaked urine? N  Do you have problems with loss of bowel control? N  Managing your Medications? N  Managing your Finances? N  Housekeeping or managing your Housekeeping? N    Patient Care Team: Kennyth Worth HERO, MD as PCP - General (Family Medicine) Clark-Burning, Delon, PA-C (Inactive) (Dermatology) Sheffield, Kelli R, PA-C (Inactive) as Physician Assistant (Dermatology)  I have updated your Care Teams any recent Medical Services you may have received from other providers in the past year.     Assessment:   This is a routine wellness examination for Kathy Joseph.  Hearing/Vision screen Hearing Screening - Comments:: Pt denies any hearing issues  Vision Screening - Comments:: Wears rx glasses - up to date with routine eye exams with [facility]    Goals Addressed   None    Depression Screen     04/04/2024    1:36 PM 03/03/2024   10:00 AM 11/27/2023   10:24 AM 04/27/2023   10:37 AM 04/15/2023    9:35 AM 04/02/2023    1:38 PM 11/19/2022    9:35 AM  PHQ 2/9 Scores  PHQ - 2 Score 0 0 0 0 0 0 0  PHQ- 9 Score    0       Fall Risk     04/04/2024    1:37 PM 11/27/2023   10:24 AM 04/27/2023   10:37 AM 04/15/2023    9:35 AM 03/30/2023    9:43 AM  Fall Risk   Falls in the past  year? 0 0 0 0 0  Number falls in past yr: 0 0 0 0 0  Injury with Fall? 0 0 0 0 0  Risk for fall due to : No Fall Risks No Fall Risks No Fall Risks No Fall Risks Impaired vision  Follow up Falls prevention discussed  Falls evaluation completed  Falls prevention discussed    MEDICARE RISK AT HOME:  Medicare Risk at Home Any stairs in or around the home?: Yes If so, are there any without handrails?: No Home free of loose throw rugs in walkways, pet beds, electrical cords, etc?: Yes Adequate lighting in your home to reduce risk of falls?: Yes Life alert?: Yes Use of a cane, walker or w/c?: No Grab bars in the bathroom?: No Shower chair or bench in shower?: Yes Elevated toilet seat or a handicapped toilet?: No  TIMED UP AND GO:  Was the test performed?  No  Cognitive Function: 6CIT completed        04/04/2024    1:38 PM 04/02/2023    1:40 PM 04/14/2022    9:44 AM 04/01/2021    9:43 AM 03/22/2020    9:38 AM  6CIT Screen  What Year? 0 points 0 points 0 points 0 points 0 points  What month? 0 points 0 points 0 points 0 points 0 points  What time? 0 points 0 points 0 points 0 points   Count back from 20 0 points 0 points 0 points 0 points 0 points  Months in reverse 0 points 0 points 0 points 0 points 0 points  Repeat phrase 0 points 0 points 0 points 0 points 0 points  Total Score 0 points 0 points 0 points 0 points     Immunizations Immunization History  Administered Date(s) Administered   Fluad Quad(high Dose 65+) 03/14/2019   INFLUENZA, HIGH DOSE SEASONAL PF 04/09/2023, 03/17/2024  Influenza,inj,Quad PF,6+ Mos 05/04/2018   Influenza-Unspecified 04/16/2022, 03/17/2024   Moderna Sars-Covid-2 Vaccination 08/24/2019, 09/21/2019, 05/28/2020   PNEUMOCOCCAL CONJUGATE-20 06/21/2020   Pfizer Covid-19 Vaccine Bivalent Booster 110yrs & up 11/30/2020   Pfizer Covid-19 Vaccine Bivalent Booster 5y-11y 06/25/2021   Pneumococcal Polysaccharide-23 03/14/2019   Zoster Recombinant(Shingrix)  08/12/2022, 11/06/2022    Screening Tests Health Maintenance  Topic Date Due   DTaP/Tdap/Td (1 - Tdap) Never done   COVID-19 Vaccine (6 - 2025-26 season) 03/21/2024   DEXA SCAN  09/17/2024   Medicare Annual Wellness (AWV)  04/04/2025   Colonoscopy  01/27/2027   Pneumococcal Vaccine: 50+ Years  Completed   Influenza Vaccine  Completed   Hepatitis C Screening  Completed   Zoster Vaccines- Shingrix  Completed   HPV VACCINES  Aged Out   Meningococcal B Vaccine  Aged Out   Mammogram  Discontinued    Health Maintenance Items Addressed: See Nurse Notes at the end of this note  Additional Screening:  Vision Screening: Recommended annual ophthalmology exams for early detection of glaucoma and other disorders of the eye. Is the patient up to date with their annual eye exam?  Yes  Who is the provider or what is the name of the office in which the patient attends annual eye exams? [facility]   Dental Screening: Recommended annual dental exams for proper oral hygiene  Community Resource Referral / Chronic Care Management: CRR required this visit?  No   CCM required this visit?  No   Plan:    I have personally reviewed and noted the following in the patient's chart:   Medical and social history Use of alcohol, tobacco or illicit drugs  Current medications and supplements including opioid prescriptions. Patient is not currently taking opioid prescriptions. Functional ability and status Nutritional status Physical activity Advanced directives List of other physicians Hospitalizations, surgeries, and ER visits in previous 12 months Vitals Screenings to include cognitive, depression, and falls Referrals and appointments  In addition, I have reviewed and discussed with patient certain preventive protocols, quality metrics, and best practice recommendations. A written personalized care plan for preventive services as well as general preventive health recommendations  were provided to patient.   Ellouise VEAR Haws, LPN   [id number]   After Visit Summary: (MyChart) Due to this being a telephonic visit, the after visit summary with patients personalized plan was offered to patient via MyChart   Notes: Nothing significant to report at this time.

## 2024-04-06 ENCOUNTER — Ambulatory Visit (INDEPENDENT_AMBULATORY_CARE_PROVIDER_SITE_OTHER): Admitting: Family Medicine

## 2024-04-06 ENCOUNTER — Encounter: Payer: Self-pay | Admitting: Family Medicine

## 2024-04-06 VITALS — BP 119/77 | HR 67 | Temp 97.3°F | Ht 66.5 in | Wt 177.0 lb

## 2024-04-06 DIAGNOSIS — E538 Deficiency of other specified B group vitamins: Secondary | ICD-10-CM | POA: Insufficient documentation

## 2024-04-06 DIAGNOSIS — E559 Vitamin D deficiency, unspecified: Secondary | ICD-10-CM | POA: Diagnosis not present

## 2024-04-06 DIAGNOSIS — D72819 Decreased white blood cell count, unspecified: Secondary | ICD-10-CM | POA: Insufficient documentation

## 2024-04-06 DIAGNOSIS — E669 Obesity, unspecified: Secondary | ICD-10-CM

## 2024-04-06 DIAGNOSIS — E785 Hyperlipidemia, unspecified: Secondary | ICD-10-CM

## 2024-04-06 LAB — LIPID PANEL
Cholesterol: 188 mg/dL (ref 0–200)
HDL: 69 mg/dL (ref >39.00)
LDL Cholesterol: 104 mg/dL — ABNORMAL HIGH (ref 0–99)
NonHDL: 119.47
Total CHOL/HDL Ratio: 3
Triglycerides: 78 mg/dL (ref 0.0–149.0)
VLDL: 15.6 mg/dL (ref 0.0–40.0)

## 2024-04-06 LAB — VITAMIN B12: Vitamin B-12: 375 pg/mL (ref 211–911)

## 2024-04-06 LAB — VITAMIN D 25 HYDROXY (VIT D DEFICIENCY, FRACTURES): VITD: 48.57 ng/mL (ref 30.00–100.00)

## 2024-04-06 LAB — HEMOGLOBIN A1C: Hgb A1c MFr Bld: 5.5 % (ref 4.6–6.5)

## 2024-04-06 MED ORDER — ZEPBOUND 15 MG/0.5ML ~~LOC~~ SOLN: 15.0000 mg | SUBCUTANEOUS | 4 refills | Status: DC

## 2024-04-06 NOTE — Assessment & Plan Note (Signed)
 Reviewed recent CBC which shows slightly low WBC.  She is following with oncology and they are monitoring once or twice per year.

## 2024-04-06 NOTE — Assessment & Plan Note (Signed)
 Check vitamin D.

## 2024-04-06 NOTE — Progress Notes (Signed)
   Kathy Joseph is a 68 y.o. female who presents today for an office visit.  Assessment/Plan:   Chronic Problems Addressed Today: Obesity She is doing very well with Zepbound .  She is currently on 15 mg weekly and tolerating well.  She is down almost 30 pounds since last year.  She would like to lose another 15 to 20 pounds if possible.  Will continue her current dose of Zepbound  and she will follow-up with us  in a few months via MyChart.  Once she hits her goal weight we can discuss weaning down on Zepbound  as tolerated.  Avitaminosis D Check vitamin D .   B12 deficiency Check B12.   Leukopenia Reviewed recent CBC which shows slightly low WBC.  She is following with oncology and they are monitoring once or twice per year.     Subjective:  HPI:  See assessment / plan for status of chronic conditions.    Discussed the use of AI scribe software for clinical note transcription with the patient, who gave verbal consent to proceed.  History of Present Illness Kathy Joseph is a 68 year old female who presents for follow-up regarding Zepbound  use and weight management.  She has been using Zepbound  for weight management and is satisfied with her current weight loss but is interested in losing more. She administers the medication in her thighs due to bruising on the left side of her abdomen. She has been on the 15 mg dose for approximately nine weeks and notes a decrease in sugar cravings until recently. No significant issues with Zepbound  aside from bruising.  She has a history of low white blood cell count, which fluctuates over time. Her blood work from August 14 showed a slightly low total white blood cell count. This has been monitored over time, noting the cyclical nature of the counts.  She reports improved breathing with weight loss but experiences increased nighttime awakenings, possibly due to increased fluid intake to prevent leg cramps. She notes a sensation of  needing to urinate more frequently than the actual volume suggests.  She has resumed some physical activity, including cycling, and plans to engage in water aerobics to improve muscle tone. She is also taking vitamin D  and B12 supplements.         Objective:  Physical Exam: BP 119/77   Pulse 67   Temp (!) 97.3 F (36.3 C) (Temporal)   Ht 5' 6.5 (1.689 m)   Wt 177 lb (80.3 kg)   SpO2 98%   BMI 28.14 kg/m   Wt Readings from Last 3 Encounters:  04/06/24 177 lb (80.3 kg)  04/04/24 180 lb (81.6 kg)  03/03/24 187 lb 8 oz (85 kg)    Gen: No acute distress, resting comfortably Neuro: Grossly normal, moves all extremities Psych: Normal affect and thought content      Anaily Ashbaugh M. Kennyth, MD 04/06/2024 10:28 AM

## 2024-04-06 NOTE — Assessment & Plan Note (Signed)
 She is doing very well with Zepbound .  She is currently on 15 mg weekly and tolerating well.  She is down almost 30 pounds since last year.  She would like to lose another 15 to 20 pounds if possible.  Will continue her current dose of Zepbound  and she will follow-up with us  in a few months via MyChart.  Once she hits her goal weight we can discuss weaning down on Zepbound  as tolerated.

## 2024-04-06 NOTE — Patient Instructions (Signed)
 It was very nice to see you today!  VISIT SUMMARY: You had a follow-up visit to discuss your use of Zepbound  for weight management and other health concerns. You are satisfied with your weight loss but wish to lose more. We also discussed your overactive bladder symptoms and chronic mild leukopenia.  YOUR PLAN: OBESITY: You are managing your weight with Zepbound  and have seen positive results, including weight loss and improved breathing. -Continue taking Zepbound  15 mg weekly until you reach your goal weight. -Switch to thigh injections to avoid bruising on your abdomen. -Monitor your weight and report your progress in a couple of months. -We will discuss tapering off Zepbound  once you achieve your goal weight.  OVERACTIVE BLADDER SYMPTOMS: You have been experiencing increased frequency of urination, especially at night, possibly due to increased fluid intake to prevent leg cramps. -Monitor your symptoms and adjust your fluid intake as needed.  CHRONIC MILD LEUKOPENIA: You have a history of low white blood cell count, which fluctuates over time. Your recent blood work showed a slightly low total white blood cell count, but it is not clinically significant. -We will continue to monitor your white blood cell counts once or twice a year.  Return in about 3 months (around 07/06/2024).   Take care, Dr Kennyth  PLEASE NOTE:  If you had any lab tests, please let us  know if you have not heard back within a few days. You may see your results on mychart before we have a chance to review them but we will give you a call once they are reviewed by us .   If we ordered any referrals today, please let us  know if you have not heard from their office within the next week.   If you had any urgent prescriptions sent in today, please check with the pharmacy within an hour of our visit to make sure the prescription was transmitted appropriately.   Please try these tips to maintain a healthy  lifestyle:  Eat at least 3 REAL meals and 1-2 snacks per day.  Aim for no more than 5 hours between eating.  If you eat breakfast, please do so within one hour of getting up.   Each meal should contain half fruits/vegetables, one quarter protein, and one quarter carbs (no bigger than a computer mouse)  Cut down on sweet beverages. This includes juice, soda, and sweet tea.   Drink at least 1 glass of water with each meal and aim for at least 8 glasses per day  Exercise at least 150 minutes every week.

## 2024-04-06 NOTE — Assessment & Plan Note (Signed)
 Check B12

## 2024-04-07 ENCOUNTER — Ambulatory Visit: Payer: Self-pay | Admitting: Family Medicine

## 2024-04-07 DIAGNOSIS — E785 Hyperlipidemia, unspecified: Secondary | ICD-10-CM

## 2024-04-07 NOTE — Progress Notes (Signed)
 Her LDL is a little bit elevated but the rest of her labs are all at goal.  She may benefit from statin to improve her cholesterol numbers and low risk of heart attack and stroke.  Please send in Lipitor 40 mg daily if she is agreeable to start.  Regardless, she should continue to work on diet and exercise and we can recheck everything in a year or so.

## 2024-06-24 ENCOUNTER — Encounter: Payer: Self-pay | Admitting: Family Medicine

## 2024-06-24 NOTE — Telephone Encounter (Signed)
 Sounds like it may be a stye. Recommend she come in if not improving in the next few days

## 2024-06-24 NOTE — Telephone Encounter (Signed)
 Patient notified of Dr. Texie note via MyChart.

## 2024-07-01 DIAGNOSIS — Z1231 Encounter for screening mammogram for malignant neoplasm of breast: Secondary | ICD-10-CM | POA: Diagnosis not present

## 2024-08-17 ENCOUNTER — Other Ambulatory Visit: Payer: Self-pay | Admitting: Family Medicine

## 2024-09-02 ENCOUNTER — Inpatient Hospital Stay

## 2024-09-02 ENCOUNTER — Ambulatory Visit: Admitting: Oncology

## 2024-09-02 ENCOUNTER — Other Ambulatory Visit

## 2024-09-02 ENCOUNTER — Inpatient Hospital Stay: Admitting: Oncology
